# Patient Record
Sex: Male | Born: 1950 | Race: White | Hispanic: No | Marital: Married | Smoking: Former smoker
Health system: Southern US, Community
[De-identification: ages and names within clinical notes are randomized; demographics above are authoritative.]

## PROBLEM LIST (undated history)

## (undated) DIAGNOSIS — F419 Anxiety disorder, unspecified: Secondary | ICD-10-CM

## (undated) DIAGNOSIS — F329 Major depressive disorder, single episode, unspecified: Secondary | ICD-10-CM

## (undated) DIAGNOSIS — J45909 Unspecified asthma, uncomplicated: Secondary | ICD-10-CM

## (undated) DIAGNOSIS — J449 Chronic obstructive pulmonary disease, unspecified: Secondary | ICD-10-CM

## (undated) DIAGNOSIS — K5730 Diverticulosis of large intestine without perforation or abscess without bleeding: Secondary | ICD-10-CM

## (undated) DIAGNOSIS — M1990 Unspecified osteoarthritis, unspecified site: Secondary | ICD-10-CM

## (undated) DIAGNOSIS — F32A Depression, unspecified: Secondary | ICD-10-CM

## (undated) DIAGNOSIS — E785 Hyperlipidemia, unspecified: Secondary | ICD-10-CM

## (undated) DIAGNOSIS — E049 Nontoxic goiter, unspecified: Secondary | ICD-10-CM

## (undated) HISTORY — PX: HAND SURGERY: SHX662

## (undated) HISTORY — DX: Anxiety disorder, unspecified: F41.9

## (undated) HISTORY — DX: Hyperlipidemia, unspecified: E78.5

## (undated) HISTORY — DX: Unspecified osteoarthritis, unspecified site: M19.90

## (undated) HISTORY — DX: Diverticulosis of large intestine without perforation or abscess without bleeding: K57.30

## (undated) HISTORY — DX: Nontoxic goiter, unspecified: E04.9

## (undated) HISTORY — PX: CHOLECYSTECTOMY: SHX55

## (undated) HISTORY — PX: APPENDECTOMY: SHX54

---

## 2002-01-28 ENCOUNTER — Emergency Department (HOSPITAL_COMMUNITY): Admission: EM | Admit: 2002-01-28 | Discharge: 2002-01-28 | Payer: Self-pay | Admitting: Emergency Medicine

## 2002-11-19 ENCOUNTER — Emergency Department (HOSPITAL_COMMUNITY): Admission: EM | Admit: 2002-11-19 | Discharge: 2002-11-19 | Payer: Self-pay

## 2002-11-28 ENCOUNTER — Ambulatory Visit (HOSPITAL_COMMUNITY): Admission: RE | Admit: 2002-11-28 | Discharge: 2002-11-28 | Payer: Self-pay | Admitting: General Surgery

## 2004-03-12 ENCOUNTER — Emergency Department (HOSPITAL_COMMUNITY): Admission: EM | Admit: 2004-03-12 | Discharge: 2004-03-12 | Payer: Self-pay | Admitting: Emergency Medicine

## 2004-04-19 ENCOUNTER — Ambulatory Visit (HOSPITAL_BASED_OUTPATIENT_CLINIC_OR_DEPARTMENT_OTHER): Admission: RE | Admit: 2004-04-19 | Discharge: 2004-04-19 | Payer: Self-pay | Admitting: Orthopedic Surgery

## 2004-04-19 ENCOUNTER — Encounter (INDEPENDENT_AMBULATORY_CARE_PROVIDER_SITE_OTHER): Payer: Self-pay | Admitting: Specialist

## 2004-04-19 ENCOUNTER — Ambulatory Visit (HOSPITAL_COMMUNITY): Admission: RE | Admit: 2004-04-19 | Discharge: 2004-04-19 | Payer: Self-pay | Admitting: Orthopedic Surgery

## 2006-03-16 ENCOUNTER — Emergency Department (HOSPITAL_COMMUNITY): Admission: EM | Admit: 2006-03-16 | Discharge: 2006-03-16 | Payer: Self-pay | Admitting: Emergency Medicine

## 2006-03-24 ENCOUNTER — Emergency Department (HOSPITAL_COMMUNITY): Admission: EM | Admit: 2006-03-24 | Discharge: 2006-03-24 | Payer: Self-pay | Admitting: Emergency Medicine

## 2006-03-26 ENCOUNTER — Emergency Department (HOSPITAL_COMMUNITY): Admission: EM | Admit: 2006-03-26 | Discharge: 2006-03-26 | Payer: Self-pay | Admitting: Emergency Medicine

## 2006-12-04 ENCOUNTER — Ambulatory Visit (HOSPITAL_COMMUNITY): Admission: RE | Admit: 2006-12-04 | Discharge: 2006-12-04 | Payer: Self-pay | Admitting: General Surgery

## 2007-04-16 ENCOUNTER — Encounter (INDEPENDENT_AMBULATORY_CARE_PROVIDER_SITE_OTHER): Payer: Self-pay | Admitting: Orthopedic Surgery

## 2007-04-16 ENCOUNTER — Ambulatory Visit (HOSPITAL_BASED_OUTPATIENT_CLINIC_OR_DEPARTMENT_OTHER): Admission: RE | Admit: 2007-04-16 | Discharge: 2007-04-16 | Payer: Self-pay | Admitting: Orthopedic Surgery

## 2010-02-27 ENCOUNTER — Encounter: Payer: Self-pay | Admitting: Chiropractic Medicine

## 2010-06-21 NOTE — H&P (Signed)
NAME:  [name], [name]                ACCOUNT NO.:  [id number]   MEDICAL RECORD NO.:  [id number]          PATIENT TYPE:  AMB   LOCATION:  DAY                           FACILITY:  [facility]   PHYSICIAN:  Dalia Heading, M.D.  DATE OF BIRTH:  [birth year]   DATE OF ADMISSION:  12/04/2006  DATE OF DISCHARGE:  LH                              HISTORY & PHYSICAL   AGE:  Is 60 years old.   CHIEF COMPLAINT:  Need for screening colonoscopy.   HISTORY OF PRESENT ILLNESS:  The patient is a 60 year old white male who  was referred for endoscopic evaluation.  He needs a colonoscopy for  screening purposes.  No abdominal pain, weight loss, nausea, vomiting,  diarrhea, constipation, melena or hematochezia have been noted.  He has  never had a colonoscopy.  There is no family history of colon carcinoma.   PAST MEDICAL HISTORY:  Includes depression.   PAST SURGICAL HISTORY:  Appendectomy, laparoscopic cholecystectomy.   CURRENT MEDICATIONS:  Paxil.   ALLERGIES:  No known drug allergies.   REVIEW OF SYSTEMS:  Noncontributory.   PHYSICAL EXAMINATION:  GENERAL:  The patient is a well-developed and  well-nourished white male, in no acute distress.  LUNGS:  Clear to auscultation with good breath sounds bilaterally.  HEART:  Exam reveals a regular rate and rhythm without S3, S4 or  murmurs.  ABDOMEN:  Soft, nontender, non-distended.  No hepatosplenomegaly or  masses are noted.  RECTAL:  Deferred until the procedure.   IMPRESSION:  Need for screening colonoscopy.   PLAN:  The patient is scheduled for a colonoscopy on December 04, 2006.  The risks and benefits of the procedure, including bleeding and  perforation, were fully explained to the patient who gave an informed  consent.      Dalia Heading, M.D.  Electronically Signed     MAJ/MEDQ  D:  11/27/2006  T:  11/28/2006  Job:  [id number]   cc:   Patrica Duel, M.D.  Fax: [phone]

## 2010-06-21 NOTE — Op Note (Signed)
NAME:  [name], [name]                ACCOUNT NO.:  [id number]   MEDICAL RECORD NO.:  [id number]          PATIENT TYPE:  AMB   LOCATION:  DSC                          FACILITY:  [facility]   PHYSICIAN:  Katy Fitch. Sypher, M.D. DATE OF BIRTH:  October 09, 1950   DATE OF PROCEDURE:  04/16/2007  DATE OF DISCHARGE:                               OPERATIVE REPORT   PREOPERATIVE DIAGNOSIS:  Recurrent enchondroma, right small finger  proximal phalanx, status post pathologic fracture, followed by curettage  of bone grafting completed April 19, 2004.   POSTOPERATIVE DIAGNOSIS:  Recurrent enchondroma, right small finger  proximal phalanx, status post pathologic fracture, followed by curettage  of bone grafting completed April 19, 2004.   OPERATION:  Extensive curettage of right small finger proximal phalanx,  proximal metaphysis and diaphysis followed by a pressure saline  irrigation to remove all residual enchondroma, followed by a freeze-  dried cancellus bone graft.   OPERATING SURGEON:  Josephine Igo, M.D.   ASSISTANT:  Annye Rusk, PA-C.   ANESTHESIA:  General by LMA.   SUPERVISING ANESTHESIOLOGIST:  Dr. Noreene Larsson.   INDICATIONS:  [name] is a 60 year old gentleman referred through  the courtesy of Dr. [name] of [place].  He has a history of fracturing his right small finger proximal phalanx  in 2006.  He was noted to have a large enchondroma in the proximal  metaphysis.  He was initially immobilized to obtain a stable healed  fracture and subsequent underwent curettage and bone grafting of his  enchondroma.   In 2006 he appeared to be healing his enchondroma in a satisfactory  manner.  He was lost to follow-up skipping his last office appointment  and did not have a final follow-up x-ray.   He was contacted by phone, but apparently chose not to return for a  final evaluation.   He did well until early 2009 at which time he returned to the office  reporting that when he shook hands, he had some pain in his small  finger.  Once again.   X-rays of his finger were obtained and he was noted to have a extension  of his enchondroma towards the diaphysis of the small finger proximal  phalanx.  The site of prior fracture and the prior metaphyseal bone  graft appeared to be well incorporated.   We advised [name] to proceed with a secondary procedure at this time  anticipating a curettage of the diaphyseal segment of his enchondroma  and additional bone grafting.  Preoperatively he was reminded of the  uncertain prognosis of these benign tumors.   Typically enchondromas are identified as pathologic fractures.   It is optimum to monitor them with serial x-rays to be certain that they  are eradicated.   We will encourage him to continue with routine follow-up in the future.   Preoperatively he was interviewed by Dr. Noreene Larsson to discuss anesthesia  choices risks and benefits.  [name] decided to proceed with general  anesthesia by LMA technique.  Questions were invited and answered in  detail.   PROCEDURE:  James Maduro  Johntay Johns is brought to the operating room and  placed supine position on the operating table.   Following induction general anesthesia by LMA technique, the right arm  was prepped with Betadine soap solution, sterilely draped.  A pneumatic  tourniquet was applied to the proximal right brachium.  1 gram of Ancef  was administered as IV prophylactic antibiotic.   Following exsanguination of the right arm with an Esmarch bandage, the  arterial tourniquet inflated to 220 mmHg.  Procedure commenced with a  longitudinal and ulnar mid lateral incision exposing the proximal  phalangeal diaphysis.   The extensor mechanism was identified and the periosteum isolated.  Retractors were placed followed by use of an 18 gauge needle to trephine  the cortex.  The cortex at the central diaphysis was thin due to the  presence of the  expansile enchondroma.  We easily entered the  intermedullary canal and obtained a core biopsy of the enchondroma.  A C-  arm fluoroscope was used to outline the length of the enchondroma  followed by trephining with a 18 gauge needle once again and use of a 64  beaver blade to create an oval window through the ulnar cortex of the  proximal phalanx.   We spent more than 30 minutes meticulously curetting the enchondroma  cavity with a micro curette, both straight and curved and pressure  irrigating the cavity with saline injected through an 18 gauge needle  and use of suction irrigation with the catheter placed within the cavity  and irrigation being provided with a syringe.   Thereafter, we used the C-arm fluoroscope to delineate the area of  curettage and deemed an acceptable curettage of the entire enchondroma  chamber based on the C-arm images.   A freeze-dried cancellus allograft was then meticulously packed into the  cavity created using primarily small cancellus flakes followed by larger  fragments to occlude the oval window created.   After this was completed, the C-arm fluoroscope was used to confirm a  very satisfactory obliteration of the cavity of the enchondroma.   The wound was then repaired with intradermal 3-0 Prolene and Steri-  Strips.  The finger was anesthetized with a 2% lidocaine metacarpal head  level block for postop analgesia.  For aftercare, [name] is provided  prescriptions for  Percocet 5 mg one by mouth every four to six hours as needed for pain,  30 tablets without refill.  Also Motrin 600 mg one by mouth every six  hours as needed for pain and Keflex 500 mg one by mouth every eight  hours x4 days as a prophylactic antibiotic.      Katy Fitch Sypher, M.D.  Electronically Signed     RVS/MEDQ  D:  04/16/2007  T:  04/17/2007  Job:  [id number]   cc:   [name]

## 2010-06-21 NOTE — H&P (Signed)
NAME:  ABDULKARIM, [name]                ACCOUNT NO.:  [id number]   MEDICAL RECORD NO.:  [id number]          PATIENT TYPE:  AMB   LOCATION:  DAY                           FACILITY:  [facility]   PHYSICIAN:  Dalia Heading, M.D.  DATE OF BIRTH:  [birth year]   DATE OF ADMISSION:  12/04/2006  DATE OF DISCHARGE:  LH                              HISTORY & PHYSICAL   AGE:  Is 60 years old.   CHIEF COMPLAINT:  Need for screening colonoscopy.   HISTORY OF PRESENT ILLNESS:  The patient is a 59 year old white male who  was referred for endoscopic evaluation.  He needs a colonoscopy for  screening purposes.  No abdominal pain, weight loss, nausea, vomiting,  diarrhea, constipation, melena or hematochezia have been noted.  He has  never had a colonoscopy.  There is no family history of colon carcinoma.   PAST MEDICAL HISTORY:  Includes depression.   PAST SURGICAL HISTORY:  Appendectomy, laparoscopic cholecystectomy.   CURRENT MEDICATIONS:  Paxil.   ALLERGIES:  No known drug allergies.   REVIEW OF SYSTEMS:  Noncontributory.   PHYSICAL EXAMINATION:  GENERAL:  The patient is a well-developed and  well-nourished white male, in no acute distress.  LUNGS:  Clear to auscultation with good breath sounds bilaterally.  HEART:  Exam reveals a regular rate and rhythm without S3, S4 or  murmurs.  ABDOMEN:  Soft, nontender, non-distended.  No hepatosplenomegaly or  masses are noted.  RECTAL:  Deferred until the procedure.   IMPRESSION:  Need for screening colonoscopy.   PLAN:  The patient is scheduled for a colonoscopy on December 04, 2006.  The risks and benefits of the procedure, including bleeding and  perforation, were fully explained to the patient who gave an informed  consent.      Dalia Heading, M.D.  Electronically Signed     MAJ/MEDQ  D:  11/27/2006  T:  11/28/2006  Job:  [id number]   cc:   Patrica Duel, M.D.  Fax: [phone]

## 2010-06-24 NOTE — Op Note (Signed)
NAME:  LEMONTE, AL                          ACCOUNT NO.:  [id number]   MEDICAL RECORD NO.:  [id number]                   PATIENT TYPE:  AMB   LOCATION:  DAY                                  FACILITY:  [facility]   PHYSICIAN:  Dalia Heading, M.D.               DATE OF BIRTH:  November 01, 1950   DATE OF PROCEDURE:  11/28/2002  DATE OF DISCHARGE:                                 OPERATIVE REPORT   PREOPERATIVE DIAGNOSES:  Cholecystitis, cholelithiasis.   POSTOPERATIVE DIAGNOSES:  Cholecystitis, cholelithiasis.   PROCEDURE:  Laparoscopic cholecystectomy.   SURGEON:  Dalia Heading, M.D.   ASSISTANT:  Bernerd Limbo. Leona Carry, M.D.   ANESTHESIA:  General endotracheal   INDICATIONS:  The patient is a 60 year old white male who is referred for  evaluation and treatment of cholecystitis secondary to cholelithiasis.  The  risks and benefits of the procedure including bleeding, infection,  hepatobiliary injury, and the possibility of an open procedure were fully  explained to the patient, who gave informed consent.   DESCRIPTION OF PROCEDURE:  The patient was placed in the supine position.  After induction of general endotracheal anesthesia, the abdomen was prepped  and draped using the usual sterile technique with Betadine.  Surgical site  confirmation was performed.   An supraumbilical incision was made down to the fascia.  A Veress needle was  introduced into the abdominal cavity and confirmation of placement was done  using the saline drop test.  The abdomen was then insufflated to 16 mmHg  pressure.  An 11-mm trocar was introduced into the abdominal cavity under  direct visualization without difficulty.  The patient was placed then in  reverse Trendelenburg position and an additional 11-mm trocar was placed in  the epigastric region and 5-mm trocars were placed in the right upper  quadrant and right flank regions. The liver was inspected and noted to be  within normal limits.   The  gallbladder was retracted superiorly and laterally.  The dissection was  begun around the infundibulum of the gallbladder.  The cystic duct was first  identified.  Its junction to the infundibulum fully identified.  Endoclips  were placed proximally and distally on the cystic duct; and the cystic duct  was divided.  This was likewise done on the cystic artery.  The gallbladder  was then freed away from the gallbladder fossa using Bovie electrocautery.  The gallbladder was delivered through the epigastric trocar site using an  EndoCatch bag.  The gallbladder fossa was inspected and no abnormal bleeding  or bile leakage was noted.  Surgicel was placed in the gallbladder fossa,  the subhepatic space, as well as the right hepatic gutter were evacuated of  all fluid and air prior to removal of the trocars.   All wounds were irrigated with normal saline.  All wounds were injected with  0.5% Sensorcaine.  The supraumbilical fascia as well as epigastric  fascia  were reapproximated using an #0 Vicryl interrupted suture. All skin  incisions were closed using staples.  Betadine ointment and dry sterile  dressings were applied.   All tape and needle counts correct at the end of the procedure.  The patient  was extubated in the operating room and went back to recovery room in awake  and stable condition.   COMPLICATIONS:  None.   SPECIMENS:  Gallbladder with stones.   BLOOD LOSS:  Minimal.      ___________________________________________                                            Dalia Heading, M.D.   MAJ/MEDQ  D:  11/28/2002  T:  11/28/2002  Job:  [id number]   cc:   Patrica Duel, M.D.  [address]  [place]  [place] [address]  Fax: [phone]

## 2010-06-24 NOTE — Op Note (Signed)
NAME:  [name], [name]                ACCOUNT NO.:  [id number]   MEDICAL RECORD NO.:  [id number]          PATIENT TYPE:  AMB   LOCATION:  DSC                          FACILITY:  [facility]   PHYSICIAN:  Katy Fitch. Sypher Montez Hageman., M.D.DATE OF BIRTH:  Sep 21, 1950   DATE OF PROCEDURE:  04/19/2004  DATE OF DISCHARGE:                                 OPERATIVE REPORT   PREOPERATIVE DIAGNOSIS:  Status post pathologic fracture of right small  finger proximal phalangeal, proximal metaphysis due to enchondroma, status  post healing of fracture with closed reduction and splinting times five  weeks with secondary resection of enchondroma at this time and allograft  cancellus grafting.   OPERATION SURGEON:  Katy Fitch. Sypher, M.D.   ASSISTANT:  Marveen Reeks. Dasnoit, P.A.C.   ANESTHESIA:  Infraclavicular block of right upper extremity.   SUPERVISING ANESTHESIOLOGIST:  Dr. Krista Blue.   INDICATIONS:  [name] is a 60 year old gentleman referred by Dr. Earlyne Iba of the [facility] Room for evaluation of a  pathologic fracture of his right small finger proximal phalanx sustained on  March 12, 2004.   [name] was noted to have an unstable telescoping fracture with slight  loss of length due to a enchondroma that had fractured circumferentially.   We recommended at the time of his initial fraction that we treated with  splinting, allowing the fracture to heal followed by resection of the  enchondroma and cancellus allograft.   [name] has been immobilized since the time of his injury and went on to  heal his fracture with a near anatomic result except for a very slight  shortening the P1 segment.   He did not have any extensor lag at his PIP joint.   His fracture is now stable. Therefore, he returns anticipating a limited  exposure of the enchondroma followed by thorough curettage, irrigation and  cancellus allografting.   After informed consent he is brought to the operating  at this time.   Preoperatively he was advised of potential risks, benefits of surgery. He  understands there is a chance for second pathologic fracture after windowing  of his enchondroma.   We anticipate a minimum of three weeks additional splinting following  grafting.   PROCEDURE:  [name] is brought to the operating room and placed  in supine position on the operating table.   Following an infraclavicular block by Dr. Krista Blue in the holding area,  anesthesia satisfactory in the right arm.   The arm was prepped with Betadine soap solution, sterilely draped. Ancef 1 g  was administered as an IV prophylactic antibiotic.   Following exsanguination of the right arm with an Esmarch bandage, an  arterial tourniquet on the proximal brachium was inflated to 240 mmHg.   Procedure commenced with a curvilinear incision over the proximal two thirds  of the P1 segment of his right small finger. Subcutaneous tissue were  carefully divided taking care to identify and electrocauterize the  transverse veins overlying the extensor mechanism.   The extensor digitorum longus was incised longitudinally and the periosteum  of  the proximal phalanx was incised over a distance of 1.5 cm.   A small window was created on the dorsal aspect of P1 measuring 4 mm in  length and 3 mm width.   The enchondroma was meticulously curetted, removing more than 1 cubic mL of  immature cartilage and calcified material. This was placed in formalin and  passed off for pathologic evaluation.   The enchondroma cavity was then thoroughly irrigated with a dental needle  and a 10 mL syringe until all effluent was clear. The lesion was then  curetted the second time tract remove all membrane and remnants of  cartilage.   After final irrigation, the cavity was then packed with a finely ground  cancellus allograft and basically packed as tight as possible.   The cortical window was then replaced and the  periosteum repaired with  interrupted suture of 4-0 Vicryl with knots buried.   The extensor mechanism then repaired with a running suture of 4-0 Mersilene  and the skin repaired with intradermal 3-0 Prolene.   [name] was placed in the safe position with his ring and small fingers  buddy-strapped. He is placed a hand sandwich splint to protect his  construct.   There were no apparent complications.   For aftercare, he is given prescriptions for Percocet 5 mg one p.o. q.4-6h.  for pain 30 tablets without refill. Also, Keflex 5 mg one p.o. q.8 hours x4  days as a prophylactic antibiotic.   He is discharged home to the care of his family. He will return to the  office for follow-up in one week for dressing change and repeat x-ray.   As stated before, he will need to be splinted a minimum three weeks and  perhaps as long as six weeks pending clear healing of his fracture.      RVS/MEDQ  D:  04/19/2004  T:  04/19/2004  Job:  [id number]

## 2010-10-31 LAB — POCT HEMOGLOBIN-HEMACUE: Hemoglobin: 16.2

## 2011-01-22 ENCOUNTER — Emergency Department (HOSPITAL_COMMUNITY): Payer: PRIVATE HEALTH INSURANCE

## 2011-01-22 ENCOUNTER — Encounter: Payer: Self-pay | Admitting: *Deleted

## 2011-01-22 ENCOUNTER — Emergency Department (HOSPITAL_COMMUNITY)
Admission: EM | Admit: 2011-01-22 | Discharge: 2011-01-22 | Disposition: A | Discharge status: Home / Self Care | Payer: PRIVATE HEALTH INSURANCE | Attending: Emergency Medicine | Admitting: Emergency Medicine

## 2011-01-22 DIAGNOSIS — J40 Bronchitis, not specified as acute or chronic: Secondary | ICD-10-CM

## 2011-01-22 DIAGNOSIS — J111 Influenza due to unidentified influenza virus with other respiratory manifestations: Secondary | ICD-10-CM

## 2011-01-22 DIAGNOSIS — R05 Cough: Secondary | ICD-10-CM | POA: Insufficient documentation

## 2011-01-22 DIAGNOSIS — R0602 Shortness of breath: Secondary | ICD-10-CM | POA: Insufficient documentation

## 2011-01-22 DIAGNOSIS — R51 Headache: Secondary | ICD-10-CM | POA: Insufficient documentation

## 2011-01-22 DIAGNOSIS — R059 Cough, unspecified: Secondary | ICD-10-CM | POA: Insufficient documentation

## 2011-01-22 DIAGNOSIS — R6889 Other general symptoms and signs: Secondary | ICD-10-CM | POA: Insufficient documentation

## 2011-01-22 HISTORY — DX: Depression, unspecified: F32.A

## 2011-01-22 HISTORY — DX: Major depressive disorder, single episode, unspecified: F32.9

## 2011-01-22 MED ORDER — OSELTAMIVIR PHOSPHATE 75 MG PO CAPS: 75.0000 mg | ORAL_CAPSULE | Freq: Two times a day (BID) | ORAL | Status: AC

## 2011-01-22 MED ORDER — AZITHROMYCIN 250 MG PO TABS: ORAL_TABLET | ORAL | Status: DC

## 2011-01-22 MED ORDER — ALBUTEROL SULFATE (5 MG/ML) 0.5% IN NEBU
5.0000 mg | INHALATION_SOLUTION | Freq: Once | RESPIRATORY_TRACT | Status: AC
  Administered 2011-01-22: 5 mg via RESPIRATORY_TRACT
  Filled 2011-01-22: qty 1

## 2011-01-22 MED ORDER — IPRATROPIUM BROMIDE 0.02 % IN SOLN
0.5000 mg | Freq: Once | RESPIRATORY_TRACT | Status: AC
  Administered 2011-01-22: 0.5 mg via RESPIRATORY_TRACT
  Filled 2011-01-22: qty 2.5

## 2011-01-22 MED ORDER — PREDNISONE 20 MG PO TABS
40.0000 mg | ORAL_TABLET | Freq: Once | ORAL | Status: AC
  Administered 2011-01-22: 40 mg via ORAL
  Filled 2011-01-22: qty 2

## 2011-01-22 MED ORDER — PREDNISONE 20 MG PO TABS: 40.0000 mg | ORAL_TABLET | Freq: Every day | ORAL | Status: DC

## 2011-01-22 NOTE — Discharge Instructions (Signed)
Bronchitis Bronchitis is the body's way of reacting to injury and/or infection (inflammation) of the bronchi. Bronchi are the air tubes that extend from the windpipe into the lungs. If the inflammation becomes severe, it may cause shortness of breath. CAUSES  Inflammation may be caused by:  A virus.   Germs (bacteria).   Dust.   Allergens.   Pollutants and many other irritants.  The cells lining the bronchial tree are covered with tiny hairs (cilia). These constantly beat upward, away from the lungs, toward the mouth. This keeps the lungs free of pollutants. When these cells become too irritated and are unable to do their job, mucus begins to develop. This causes the characteristic cough of bronchitis. The cough clears the lungs when the cilia are unable to do their job. Without either of these protective mechanisms, the mucus would settle in the lungs. Then you would develop pneumonia. Smoking is a common cause of bronchitis and can contribute to pneumonia. Stopping this habit is the single most important thing you can do to help yourself. TREATMENT   Your caregiver may prescribe an antibiotic if the cough is caused by bacteria. Also, medicines that open up your airways make it easier to breathe. Your caregiver may also recommend or prescribe an expectorant. It will loosen the mucus to be coughed up. Only take over-the-counter or prescription medicines for pain, discomfort, or fever as directed by your caregiver.   Removing whatever causes the problem (smoking, for example) is critical to preventing the problem from getting worse.   Cough suppressants may be prescribed for relief of cough symptoms.   Inhaled medicines may be prescribed to help with symptoms now and to help prevent problems from returning.   For those with recurrent (chronic) bronchitis, there may be a need for steroid medicines.  SEEK IMMEDIATE MEDICAL CARE IF:   During treatment, you develop more pus-like mucus  (purulent sputum).   You have a fever.   Your baby is older than 3 months with a rectal temperature of 102 F (38.9 C) or higher.   Your baby is 3 months old or younger with a rectal temperature of 100.4 F (38 C) or higher.   You become progressively more ill.   You have increased difficulty breathing, wheezing, or shortness of breath.  It is necessary to seek immediate medical care if you are elderly or sick from any other disease. MAKE SURE YOU:   Understand these instructions.   Will watch your condition.   Will get help right away if you are not doing well or get worse.  Document Released: 01/23/2005 Document Revised: 10/05/2010 Document Reviewed: 12/03/2007 [employer] Patient Information 2012 [employer]. 

## 2011-01-22 NOTE — ED Provider Notes (Signed)
History     CSN: [id number] Arrival date & time: 01/22/2011  2:27 AM   First MD Initiated Contact with Patient 01/22/11 0243      Chief Complaint  Patient presents with  . Fever  . Chills  . Headache  . Generalized Body Aches  . Shortness of Breath  . Cough    (Consider location/radiation/quality/duration/timing/severity/associated sxs/prior treatment) HPI Comments: Patient reports for 24 hours had sudden onset of body aches, diffuse headache, mild shortness of breath associated with wheezing. He does have a history of asthma. He did not have his influenza vaccination this year. He endorses mild sore throat and occasionally productive cough. He denies any vomiting or diarrhea. He has been taking some over-the-counter medications with limited improvement. He also endorses some decreased by mouth intake and he feels thirsty and somewhat dehydrated. He has been using his nebulizer at home with some improvement of his shortness of breath. He denies any specific chest pain or sweats associated with it.  Patient is a 60 y.o. male presenting with fever, headaches, shortness of breath, and cough. The history is provided by the patient.  Fever Primary symptoms of the febrile illness include fever, headaches, cough and shortness of breath.  Headache  Associated symptoms include a fever and shortness of breath.  Shortness of Breath  Associated symptoms include a fever, cough and shortness of breath.  Cough Associated symptoms include headaches and shortness of breath.    Past Medical History  Diagnosis Date  . Depression     Past Surgical History  Procedure Date  . Hand surgery   . Appendectomy   . Cholecystectomy     History reviewed. No pertinent family history.  History  Substance Use Topics  . Smoking status: Former Games developer  . Smokeless tobacco: Not on file  . Alcohol Use: Yes     occasionally      Review of Systems  Constitutional: Positive for fever.  Respiratory:  Positive for cough and shortness of breath.   Neurological: Positive for headaches.  All other systems reviewed and are negative.    Allergies  Review of patient's allergies indicates no known allergies.  Home Medications   Current Outpatient Rx  Name Route Sig Dispense Refill  . PAROXETINE HCL 10 MG PO TABS Oral Take 10 mg by mouth every morning.        BP 163/79  Pulse 86  Temp(Src) 99.6 F (37.6 C) (Oral)  Resp 20  Ht 6\' 2"  (1.88 m)  Wt 210 lb (95.255 kg)  BMI 26.96 kg/m2  SpO2 94%  Physical Exam  Nursing note and vitals reviewed. Constitutional: He is oriented to person, place, and time. He appears well-developed and well-nourished. No distress.  HENT:  Head: Normocephalic and atraumatic.  Eyes: Pupils are equal, round, and reactive to light.  Neck: Normal range of motion. Neck supple.  Cardiovascular: Normal rate.   No murmur heard. Pulmonary/Chest: No respiratory distress. He has wheezes.  Abdominal: Soft. Bowel sounds are normal. He exhibits no distension. There is no tenderness. There is no rebound and no guarding.  Musculoskeletal: Normal range of motion.  Neurological: He is alert and oriented to person, place, and time.  Skin: Skin is warm. No rash noted.    ED Course  Procedures (including critical care time)  Labs Reviewed - No data to display No results found.   No diagnosis found.    MDM  Symptoms consistent with bronchitis and possibly influenza.  Nebs given, pt feels improved.  Will encourage PO intake, PO steroids also for bronchitis and will consider tamiflu if CXR shows no infiltrates.  Pt is a non smoker. RA sat 94% and normal.        I reviewed pt's CXR, per radiologist, no acute changes, no infiltrate.  Will prescribe tamiflu and prednisone for patient.    Gavin Pound. Oletta Lamas, MD 01/22/11 1610

## 2011-01-22 NOTE — ED Notes (Signed)
Pt c/o fever, chills, sob, cough, body aches, headache, poor PO intake x 24hrs.

## 2012-01-26 ENCOUNTER — Emergency Department (HOSPITAL_COMMUNITY)
Admission: EM | Admit: 2012-01-26 | Discharge: 2012-01-27 | Disposition: A | Discharge status: Home / Self Care | Payer: BC Managed Care – PPO | Attending: Emergency Medicine | Admitting: Emergency Medicine

## 2012-01-26 ENCOUNTER — Emergency Department (HOSPITAL_COMMUNITY): Payer: BC Managed Care – PPO

## 2012-01-26 ENCOUNTER — Other Ambulatory Visit: Payer: Self-pay

## 2012-01-26 ENCOUNTER — Encounter (HOSPITAL_COMMUNITY): Payer: Self-pay | Admitting: Emergency Medicine

## 2012-01-26 DIAGNOSIS — J45901 Unspecified asthma with (acute) exacerbation: Secondary | ICD-10-CM | POA: Insufficient documentation

## 2012-01-26 DIAGNOSIS — J45909 Unspecified asthma, uncomplicated: Secondary | ICD-10-CM

## 2012-01-26 DIAGNOSIS — J3489 Other specified disorders of nose and nasal sinuses: Secondary | ICD-10-CM | POA: Insufficient documentation

## 2012-01-26 DIAGNOSIS — R0789 Other chest pain: Secondary | ICD-10-CM | POA: Insufficient documentation

## 2012-01-26 DIAGNOSIS — Z79899 Other long term (current) drug therapy: Secondary | ICD-10-CM | POA: Insufficient documentation

## 2012-01-26 DIAGNOSIS — Z87891 Personal history of nicotine dependence: Secondary | ICD-10-CM | POA: Insufficient documentation

## 2012-01-26 DIAGNOSIS — R05 Cough: Secondary | ICD-10-CM | POA: Insufficient documentation

## 2012-01-26 DIAGNOSIS — R059 Cough, unspecified: Secondary | ICD-10-CM | POA: Insufficient documentation

## 2012-01-26 DIAGNOSIS — Z8659 Personal history of other mental and behavioral disorders: Secondary | ICD-10-CM | POA: Insufficient documentation

## 2012-01-26 HISTORY — DX: Unspecified asthma, uncomplicated: J45.909

## 2012-01-26 MED ORDER — IPRATROPIUM BROMIDE 0.02 % IN SOLN
RESPIRATORY_TRACT | Status: AC
  Administered 2012-01-26: 0.5 mg
  Filled 2012-01-26: qty 2.5

## 2012-01-26 MED ORDER — ALBUTEROL SULFATE (5 MG/ML) 0.5% IN NEBU
INHALATION_SOLUTION | RESPIRATORY_TRACT | Status: AC
  Administered 2012-01-26: 5 mg
  Filled 2012-01-26: qty 1

## 2012-01-26 MED ORDER — PREDNISONE 50 MG PO TABS
60.0000 mg | ORAL_TABLET | Freq: Once | ORAL | Status: AC
  Administered 2012-01-26: 60 mg via ORAL
  Filled 2012-01-26: qty 1

## 2012-01-26 NOTE — ED Notes (Signed)
Patient complaining of shortness of breath. Reports is having an asthma attack that started approximately 3 hours ago. Patient diaphoretic, room air sat 83%.

## 2012-01-26 NOTE — ED Notes (Signed)
Patient now at 90% on 4 liters of oxygen via nasal canula, respiratory has been paged.

## 2012-01-26 NOTE — ED Provider Notes (Signed)
History  This chart was scribed for [employer]. Colon Branch, MD by Bennett Scrape, ED Scribe. This patient was seen in room [facility] and the patient's care was started at 11:35 PM.   CSN: [id number]  Arrival date & time 01/26/12  2307   First MD Initiated Contact with Patient 01/26/12 2335      Chief Complaint  Patient presents with  . Shortness of Breath    The history is provided by the patient. No language interpreter was used.   [name] is a 61 y.o. male with a h/o asthma who presents to the Emergency Department complaining of gradual onset, gradually worsening, constant asthma exacerbation with associated wheezing, chest tightness, SOB and diaphoresis that start 3 hours ago. He states that the trigger for this episode was the nasal congestion and cough that started 4 days ago. He has a nebulizer and an albuterol inhaler at home but states that he did not use either. He reports that he received an albuterol breathing treatment in the ED with improvement in his symptoms. He denies known fevers, myalgias and emesis as associated symptoms. He also has a h/o depression, is an occasional alcohol user and is a former smoker.  Dr. Christene Slates.  Past Medical History  Diagnosis Date  . Depression   . Asthma     Past Surgical History  Procedure Date  . Hand surgery   . Appendectomy   . Cholecystectomy     History reviewed. No pertinent family history.  History  Substance Use Topics  . Smoking status: Former Games developer  . Smokeless tobacco: Not on file  . Alcohol Use: Yes     Comment: occasionally      Review of Systems  Constitutional: Negative for fever and chills.       A complete 10 system review of systems was obtained and all systems are negative except as noted in the HPI and PMH.   HENT: Positive for congestion. Negative for sore throat.   Respiratory: Positive for cough, chest tightness, shortness of breath and wheezing.   Cardiovascular: Negative for chest pain.   Gastrointestinal: Negative for nausea, vomiting, abdominal pain and diarrhea.  Musculoskeletal: Negative for myalgias.  All other systems reviewed and are negative.    Allergies  Review of patient's allergies indicates no known allergies.  Home Medications   Current Outpatient Rx  Name  Route  Sig  Dispense  Refill  . AZITHROMYCIN 250 MG PO TABS      2 tablets po now, then 1 tablet po qd on days 2-5   6 tablet   0   . PAROXETINE HCL 10 MG PO TABS   Oral   Take 10 mg by mouth every morning.           Marland Kitchen PREDNISONE 20 MG PO TABS   Oral   Take 2 tablets (40 mg total) by mouth daily.   10 tablet   0     Triage Vitals: BP 160/90  Pulse 112  Temp 99.3 F (37.4 C) (Oral)  Resp 26  Ht 6\' 2"  (1.88 m)  Wt 210 lb (95.255 kg)  BMI 26.96 kg/m2  SpO2 91%  Physical Exam  Nursing note and vitals reviewed. Constitutional: He is oriented to person, place, and time. He appears well-developed and well-nourished. No distress.  HENT:  Head: Normocephalic and atraumatic.  Mouth/Throat: Oropharynx is clear and moist.  Eyes: Conjunctivae normal and EOM are normal. Pupils are equal, round, and reactive to light.  Neck:  Normal range of motion. Neck supple. No tracheal deviation present.  Cardiovascular: Normal rate and regular rhythm.   No murmur heard. Pulmonary/Chest: Effort normal. No respiratory distress. He has wheezes (expiratory wheezes).       O2 sat on 2L post treatment is 89%, pt is speaking in full sentences, good air movement noted  Abdominal: Soft. There is no tenderness.  Musculoskeletal: Normal range of motion. He exhibits no edema.  Neurological: He is alert and oriented to person, place, and time.  Skin: Skin is warm and dry.  Psychiatric: He has a normal mood and affect. His behavior is normal.    ED Course  Procedures (including critical care time)  DIAGNOSTIC STUDIES: Oxygen Saturation is 91% on 2L of O2, low by my interpretation.    COORDINATION OF  CARE: 11:45 PM- Discussed treatment plan which includes prednisone with pt at bedside and pt agreed to plan. Results for orders placed during the hospital encounter of 01/26/12  CBC      Component Value Range   WBC 7.6  4.0 - 10.5 K/uL   RBC 4.65  4.22 - 5.81 MIL/uL   Hemoglobin 13.6  13.0 - 17.0 g/dL   HCT 16.1  09.6 - 04.5 %   MCV 86.0  78.0 - 100.0 fL   MCH 29.2  26.0 - 34.0 pg   MCHC 34.0  30.0 - 36.0 g/dL   RDW 40.9  81.1 - 91.4 %   Platelets 204  150 - 400 K/uL  BASIC METABOLIC PANEL      Component Value Range   Sodium 140  135 - 145 mEq/L   Potassium 4.1  3.5 - 5.1 mEq/L   Chloride 104  96 - 112 mEq/L   CO2 28  19 - 32 mEq/L   Glucose, Bld 139 (*) 70 - 99 mg/dL   BUN 15  6 - 23 mg/dL   Creatinine, Ser 7.82  0.50 - 1.35 mg/dL   Calcium 8.8  8.4 - 95.6 mg/dL   GFR calc non Af Amer 65 (*) >90 mL/min   GFR calc Af Amer 75 (*) >90 mL/min  Dg Chest 2 View  01/27/2012  *RADIOLOGY REPORT*  Clinical Data: Short of breath.  CHEST - 2 VIEW  Comparison: 01/22/2011.  Findings:  Cardiopericardial silhouette within normal limits. Mediastinal contours normal. Trachea midline.  No airspace disease or effusion.  Subsegmental atelectasis at the left lung base. Monitoring leads are projected over the chest.  IMPRESSION: No acute cardiopulmonary disease.   Original Report Authenticated By: Andreas Newport, M.D.       Date: 01/26/2012  2332  Rate: 112  Rhythm: sinus tachycardia  QRS Axis: normal  Intervals: normal  ST/T Wave abnormalities: normal  Conduction Disutrbances:incomplete RBBB  Narrative Interpretation:   Old EKG Reviewed: none available and changes noted new IRBBB, rate faster c/w 04/16/2007  0050 O2 sats remain 91% on O2 at 2L. Ordered additional treatment. Will take off O2 and ambulate. 0145 Patient off O2, sats drop to 90%. With ambulation O2 sats drop to 86-89% and HR increased to 135. 0230 Continuous albuterol neb given with improvement. 0330 Patient sleeping. O2 sats  fluctuate between 88-96%.  0430 O2 sats remain above 91%. Wheezing has not returned.  0550 Awakened. States he feels fine and would like to be discharged home. MDM  Patient with a h/o asthma with a recent cold who developed wheezing and shortness of breath. O2 sats low. Gradual improvement with nebulizer treatments and prednisone. Required continuous neb  treatment to bring he O2 sats up and sustain them.  Pt feels improved after observation and/or treatment in ED.Pt stable in ED with no significant deterioration in condition.The patient appears reasonably screened and/or stabilized for discharge and I doubt any other medical condition or other [facility] requiring further screening, evaluation, or treatment in the ED at this time prior to discharge.  I personally performed the services described in this documentation, which was scribed in my presence. The recorded information has been reviewed and considered.   MDM Reviewed: nursing note and vitals Interpretation: labs, ECG and x-ray  CRITICAL CARE Performed by: Annamarie Dawley. Total critical care time: 50 Critical care time was exclusive of separately billable procedures and treating other patients. Critical care was necessary to treat or prevent imminent or life-threatening deterioration. Critical care was time spent personally by me on the following activities: development of treatment plan with patient and/or surrogate as well as nursing, discussions with consultants, evaluation of patient's response to treatment, examination of patient, obtaining history from patient or surrogate, ordering and performing treatments and interventions, ordering and review of laboratory studies, ordering and review of radiographic studies, pulse oximetry and re-evaluation of patient's condition.         Nicoletta Dress. Colon Branch, MD 01/27/12 [phone]

## 2012-01-26 NOTE — ED Notes (Signed)
Respiratory at bedside.

## 2012-01-26 NOTE — ED Notes (Signed)
Patient oxygen saturation 88-89% on 4 liters of oxygen via nasal canula, Dr. Colon Branch notified.

## 2012-01-27 LAB — BASIC METABOLIC PANEL
Calcium: 8.8 mg/dL (ref 8.4–10.5)
GFR calc Af Amer: 75 mL/min — ABNORMAL LOW (ref >90)
GFR calc non Af Amer: 65 mL/min — ABNORMAL LOW (ref >90)
Potassium: 4.1 mEq/L (ref 3.5–5.1)
Sodium: 140 mEq/L (ref 135–145)

## 2012-01-27 LAB — CBC
Hemoglobin: 13.6 g/dL (ref 13.0–17.0)
MCHC: 34 g/dL (ref 30.0–36.0)
Platelets: 204 10*3/uL (ref 150–400)
RDW: 13.3 % (ref 11.5–15.5)

## 2012-01-27 MED ORDER — ALBUTEROL (5 MG/ML) CONTINUOUS INHALATION SOLN
10.0000 mg/h | INHALATION_SOLUTION | RESPIRATORY_TRACT | Status: AC
  Administered 2012-01-27: 10 mg/h via RESPIRATORY_TRACT
  Filled 2012-01-27: qty 20

## 2012-01-27 MED ORDER — ALBUTEROL SULFATE (5 MG/ML) 0.5% IN NEBU: 2.5000 mg | INHALATION_SOLUTION | Freq: Four times a day (QID) | RESPIRATORY_TRACT | Status: DC | PRN

## 2012-01-27 MED ORDER — PREDNISONE 10 MG PO TABS: 20.0000 mg | ORAL_TABLET | Freq: Every day | ORAL | Status: DC

## 2012-01-27 MED ORDER — IPRATROPIUM BROMIDE 0.02 % IN SOLN
0.5000 mg | Freq: Once | RESPIRATORY_TRACT | Status: AC
  Administered 2012-01-27: 0.5 mg via RESPIRATORY_TRACT
  Filled 2012-01-27: qty 2.5

## 2012-01-27 MED ORDER — ALBUTEROL SULFATE (5 MG/ML) 0.5% IN NEBU
5.0000 mg | INHALATION_SOLUTION | Freq: Once | RESPIRATORY_TRACT | Status: AC
  Administered 2012-01-27: 5 mg via RESPIRATORY_TRACT
  Filled 2012-01-27: qty 1

## 2012-01-27 NOTE — ED Notes (Signed)
Family at bedside. Patient does not need anything at this time. 

## 2012-01-27 NOTE — ED Notes (Signed)
Patient taken off of oxygen via nasal canula to assess room air oxygen saturation. O2 sat dropped to 88% on room air while resting. On ambulation O2 sat dropped to 89% and heart rate raised to 135. Dr. Colon Branch notified. Patient placed on 2 liters of oxygen via nasal canula.

## 2012-01-27 NOTE — ED Notes (Signed)
Patient is resting comfortably. 

## 2012-01-27 NOTE — ED Notes (Signed)
Patient on room air at this time. Oxygen saturation dropped to 88%, came back up to 92%. Patient sleeping at this time.

## 2013-03-26 ENCOUNTER — Ambulatory Visit: Payer: PRIVATE HEALTH INSURANCE | Admitting: Orthopedic Surgery

## 2013-04-15 ENCOUNTER — Ambulatory Visit: Payer: BC Managed Care – PPO | Admitting: Orthopedic Surgery

## 2013-04-24 ENCOUNTER — Ambulatory Visit: Payer: PRIVATE HEALTH INSURANCE | Admitting: Orthopedic Surgery

## 2013-04-27 ENCOUNTER — Encounter (HOSPITAL_COMMUNITY): Payer: Self-pay | Admitting: Emergency Medicine

## 2013-04-27 ENCOUNTER — Emergency Department (HOSPITAL_COMMUNITY)
Admission: EM | Admit: 2013-04-27 | Discharge: 2013-04-27 | Disposition: A | Discharge status: Home / Self Care | Payer: BC Managed Care – PPO | Attending: Emergency Medicine | Admitting: Emergency Medicine

## 2013-04-27 DIAGNOSIS — Z79899 Other long term (current) drug therapy: Secondary | ICD-10-CM | POA: Insufficient documentation

## 2013-04-27 DIAGNOSIS — T169XXA Foreign body in ear, unspecified ear, initial encounter: Secondary | ICD-10-CM | POA: Insufficient documentation

## 2013-04-27 DIAGNOSIS — F3289 Other specified depressive episodes: Secondary | ICD-10-CM | POA: Insufficient documentation

## 2013-04-27 DIAGNOSIS — Y929 Unspecified place or not applicable: Secondary | ICD-10-CM | POA: Insufficient documentation

## 2013-04-27 DIAGNOSIS — J45909 Unspecified asthma, uncomplicated: Secondary | ICD-10-CM | POA: Insufficient documentation

## 2013-04-27 DIAGNOSIS — IMO0002 Reserved for concepts with insufficient information to code with codable children: Secondary | ICD-10-CM | POA: Insufficient documentation

## 2013-04-27 DIAGNOSIS — F329 Major depressive disorder, single episode, unspecified: Secondary | ICD-10-CM | POA: Insufficient documentation

## 2013-04-27 DIAGNOSIS — Y9389 Activity, other specified: Secondary | ICD-10-CM | POA: Insufficient documentation

## 2013-04-27 DIAGNOSIS — T162XXA Foreign body in left ear, initial encounter: Secondary | ICD-10-CM

## 2013-04-27 DIAGNOSIS — Z87891 Personal history of nicotine dependence: Secondary | ICD-10-CM | POA: Insufficient documentation

## 2013-04-27 NOTE — ED Provider Notes (Signed)
CSN: [id number]     Arrival date & time 04/27/13  0618 History   First MD Initiated Contact with Patient 04/27/13 [phone]     Chief Complaint  Patient presents with  . Foreign Body in Ear      Patient is a 63 y.o. male presenting with foreign body in ear. The history is provided by the patient.  Foreign Body in Ear This is a new problem. The current episode started 12 to 24 hours ago. The problem occurs constantly. The problem has not changed since onset.Nothing aggravates the symptoms. Nothing relieves the symptoms.  pt was trying to get dirt out of left ear with a Q-tip when the end of it broke off in his ear He is unable to retrieve the tip  Past Medical History  Diagnosis Date  . Depression   . Asthma    Past Surgical History  Procedure Laterality Date  . Hand surgery    . Appendectomy    . Cholecystectomy     History reviewed. No pertinent family history. History  Substance Use Topics  . Smoking status: Former Games developermoker  . Smokeless tobacco: Not on file  . Alcohol Use: Yes     Comment: occasionally    Review of Systems  Constitutional: Negative for fever.  HENT: Positive for ear pain.       Allergies  Review of patient's allergies indicates no known allergies.  Home Medications   Current Outpatient Rx  Name  Route  Sig  Dispense  Refill  . albuterol (PROVENTIL) (5 MG/ML) 0.5% nebulizer solution   Nebulization   Take 0.5 mLs (2.5 mg total) by nebulization every 6 (six) hours as needed for wheezing.   20 mL   12   . PARoxetine (PAXIL) 10 MG tablet   Oral   Take 10 mg by mouth every morning.            BP 134/91  Pulse 66  Temp(Src) 97.9 F (36.6 C) (Oral)  Resp 14  Ht 6\' 2"  (1.88 m)  Wt 230 lb (104.327 kg)  BMI 29.52 kg/m2  SpO2 96% Physical Exam CONSTITUTIONAL: Well developed/well nourished HEAD: Normocephalic/atraumatic EYES: EOMI ENMT: Mucous membranes moist Right TM - clear and intact. Left ear- end of qtip noted in canal.  No blood noted  in canal NECK: supple no meningeal signs NEURO: Pt is awake/alert, moves all extremitiesx4 EXTREMITIES:  full ROM SKIN: warm, color normal   ED Course  FOREIGN BODY REMOVAL Date/Time: 04/27/2013 7:23 AM Performed by: Joya GaskinsWICKLINE, Antaeus Karel W Authorized by: Joya GaskinsWICKLINE, Ahmed Inniss W Patient identity confirmed: verbally with patient Patient sedated: no Patient cooperative: yes Complexity: simple Objects recovered: end of cotton swab Post-procedure assessment: foreign body removed Patient tolerance: Patient tolerated the procedure well with no immediate complications. Comments: End of cotton swab removed from left ear Pt tolerated well No bleeding noted in ear canal Left tympanic membrane intact after procedure     MDM   Final diagnoses:  Foreign body in left ear    Nursing notes including past medical history and social history reviewed and considered in documentation     Joya Gaskinsonald W Suhas Estis, MD 04/27/13 [phone]

## 2013-04-27 NOTE — ED Notes (Signed)
States was cleaning out L. ear yesterday morning with a cotton swab when a piece of the cotton broke off in it. Denies pain. States just has popping sensation.

## 2013-04-27 NOTE — Discharge Instructions (Signed)
PLEASE RETURN IF YOU HAVE ANY BLEEDING/DRAINAGE FROM YOUR EAR, OR ANY NEW HEADACHE, FEVER OR HEARING LOSS OVER NEXT 24-48 HOURS

## 2013-04-27 NOTE — ED Notes (Signed)
Patient with no complaints at this time. Respirations even and unlabored. Skin warm/dry. Discharge instructions reviewed with patient at this time. Patient given opportunity to voice concerns/ask questions. Patient discharged at this time and left Emergency Department with steady gait.   

## 2013-09-02 ENCOUNTER — Emergency Department (HOSPITAL_COMMUNITY): Payer: BC Managed Care – PPO

## 2013-09-02 ENCOUNTER — Encounter (HOSPITAL_COMMUNITY): Payer: Self-pay | Admitting: Emergency Medicine

## 2013-09-02 ENCOUNTER — Emergency Department (HOSPITAL_COMMUNITY)
Admission: EM | Admit: 2013-09-02 | Discharge: 2013-09-02 | Disposition: A | Discharge status: Home / Self Care | Payer: BC Managed Care – PPO | Attending: Emergency Medicine | Admitting: Emergency Medicine

## 2013-09-02 DIAGNOSIS — J45909 Unspecified asthma, uncomplicated: Secondary | ICD-10-CM | POA: Insufficient documentation

## 2013-09-02 DIAGNOSIS — F329 Major depressive disorder, single episode, unspecified: Secondary | ICD-10-CM | POA: Insufficient documentation

## 2013-09-02 DIAGNOSIS — Z711 Person with feared health complaint in whom no diagnosis is made: Secondary | ICD-10-CM | POA: Insufficient documentation

## 2013-09-02 DIAGNOSIS — R739 Hyperglycemia, unspecified: Secondary | ICD-10-CM

## 2013-09-02 DIAGNOSIS — F3289 Other specified depressive episodes: Secondary | ICD-10-CM | POA: Insufficient documentation

## 2013-09-02 DIAGNOSIS — Z0000 Encounter for general adult medical examination without abnormal findings: Secondary | ICD-10-CM

## 2013-09-02 DIAGNOSIS — Z87891 Personal history of nicotine dependence: Secondary | ICD-10-CM | POA: Insufficient documentation

## 2013-09-02 DIAGNOSIS — Z79899 Other long term (current) drug therapy: Secondary | ICD-10-CM | POA: Insufficient documentation

## 2013-09-02 DIAGNOSIS — R7309 Other abnormal glucose: Secondary | ICD-10-CM | POA: Insufficient documentation

## 2013-09-02 LAB — CBC WITH DIFFERENTIAL/PLATELET
BASOS ABS: 0 10*3/uL (ref 0.0–0.1)
BASOS PCT: 0 % (ref 0–1)
EOS ABS: 0.4 10*3/uL (ref 0.0–0.7)
EOS PCT: 6 % — AB (ref 0–5)
HEMATOCRIT: 39 % (ref 39.0–52.0)
Hemoglobin: 13.3 g/dL (ref 13.0–17.0)
LYMPHS PCT: 29 % (ref 12–46)
Lymphs Abs: 1.7 10*3/uL (ref 0.7–4.0)
MCH: 29.9 pg (ref 26.0–34.0)
MCHC: 34.1 g/dL (ref 30.0–36.0)
MCV: 87.6 fL (ref 78.0–100.0)
MONO ABS: 0.4 10*3/uL (ref 0.1–1.0)
Monocytes Relative: 7 % (ref 3–12)
Neutro Abs: 3.3 10*3/uL (ref 1.7–7.7)
Neutrophils Relative %: 58 % (ref 43–77)
Platelets: 193 10*3/uL (ref 150–400)
RBC: 4.45 MIL/uL (ref 4.22–5.81)
RDW: 13.4 % (ref 11.5–15.5)
WBC: 5.7 10*3/uL (ref 4.0–10.5)

## 2013-09-02 LAB — COMPREHENSIVE METABOLIC PANEL
ALT: 18 U/L (ref 0–53)
AST: 17 U/L (ref 0–37)
Albumin: 3.2 g/dL — ABNORMAL LOW (ref 3.5–5.2)
Alkaline Phosphatase: 82 U/L (ref 39–117)
Anion gap: 9 (ref 5–15)
BUN: 20 mg/dL (ref 6–23)
CALCIUM: 8.8 mg/dL (ref 8.4–10.5)
CO2: 25 meq/L (ref 19–32)
CREATININE: 1.13 mg/dL (ref 0.50–1.35)
Chloride: 106 mEq/L (ref 96–112)
GFR calc Af Amer: 79 mL/min — ABNORMAL LOW (ref >90)
GFR, EST NON AFRICAN AMERICAN: 68 mL/min — AB (ref >90)
Glucose, Bld: 173 mg/dL — ABNORMAL HIGH (ref 70–99)
Potassium: 3.9 mEq/L (ref 3.7–5.3)
Sodium: 140 mEq/L (ref 137–147)
Total Bilirubin: 0.2 mg/dL — ABNORMAL LOW (ref 0.3–1.2)
Total Protein: 5.8 g/dL — ABNORMAL LOW (ref 6.0–8.3)

## 2013-09-02 NOTE — Discharge Instructions (Signed)
Your blood sugar was a little high today. That needs to be followed closely - you may already be diabetic. Make an appointment as soon as possible to have this properly investigated and treated.

## 2013-09-02 NOTE — ED Notes (Signed)
Pt states he fells bloated in his abdomen. Did not feel well yesterday. Denies NVD.

## 2013-09-02 NOTE — ED Provider Notes (Addendum)
CSN: [id number]     Arrival date & time 09/02/13  0417 History   First MD Initiated Contact with Patient 09/02/13 0449     Chief Complaint  Patient presents with  . Bloated     (Consider location/radiation/quality/duration/timing/severity/associated sxs/prior Treatment) The history is provided by the patient.   63 year old male states that over last 2 days he has noted some swelling in his upper abdomen which he thought was unusual. He thought that the swelling should have been in the lower abdomen it was that. He generally has not felt well for the last 24 hours but does not have any specific complaints. He denies nausea or vomiting and denies change in appetite. He denies fever or chills. He denies constipation or diarrhea.  Past Medical History  Diagnosis Date  . Depression   . Asthma    Past Surgical History  Procedure Laterality Date  . Hand surgery    . Appendectomy    . Cholecystectomy     No family history on file. History  Substance Use Topics  . Smoking status: Former Games developermoker  . Smokeless tobacco: Not on file  . Alcohol Use: Yes     Comment: occasionally    Review of Systems  All other systems reviewed and are negative.     Allergies  Review of patient's allergies indicates no known allergies.  Home Medications   Prior to Admission medications   Medication Sig Start Date End Date Taking? Authorizing Provider  albuterol (PROVENTIL) (5 MG/ML) 0.5% nebulizer solution Take 0.5 mLs (2.5 mg total) by nebulization every 6 (six) hours as needed for wheezing. 01/27/12  Yes Annamarie Dawleyerry S Strand, MD  PARoxetine (PAXIL) 10 MG tablet Take 10 mg by mouth every morning.     Yes Historical Provider, MD   BP 139/82  Pulse 68  Temp(Src) 97.8 F (36.6 C) (Oral)  Resp 16  Ht 6\' 2"  (1.88 m)  Wt 230 lb (104.327 kg)  BMI 29.52 kg/m2  SpO2 95% Physical Exam  Nursing note and vitals reviewed.  63 year old male, resting comfortably and in no acute distress. Vital signs are  normal. Oxygen saturation is 95%, which is normal. Head is normocephalic and atraumatic. PERRLA, EOMI. Oropharynx is clear. There is no scleral icterus. Neck is nontender and supple without adenopathy or JVD. Back is nontender and there is no CVA tenderness. Lungs are clear without rales, wheezes, or rhonchi. Chest is nontender. Heart has regular rate and rhythm without murmur. Abdomen is soft, flat, nontender without masses and peristalsis is normoactive. Liver which is felt just distal to the costal margin but liver span is still within the normal range. Liver edge is nontender and smooth. Extremities have no cyanosis or edema, full range of motion is present. Skin is warm and dry without rash. Neurologic: Mental status is normal, cranial nerves are intact, there are no motor or sensory deficits.  ED Course  Procedures (including critical care time) Labs Review Results for orders placed during the hospital encounter of 09/02/13  CBC WITH DIFFERENTIAL      Result Value Ref Range   WBC 5.7  4.0 - 10.5 K/uL   RBC 4.45  4.22 - 5.81 MIL/uL   Hemoglobin 13.3  13.0 - 17.0 g/dL   HCT 21.339.0  08.639.0 - 57.852.0 %   MCV 87.6  78.0 - 100.0 fL   MCH 29.9  26.0 - 34.0 pg   MCHC 34.1  30.0 - 36.0 g/dL   RDW 46.913.4  62.911.5 -  15.5 %   Platelets 193  150 - 400 K/uL   Neutrophils Relative % 58  43 - 77 %   Neutro Abs 3.3  1.7 - 7.7 K/uL   Lymphocytes Relative 29  12 - 46 %   Lymphs Abs 1.7  0.7 - 4.0 K/uL   Monocytes Relative 7  3 - 12 %   Monocytes Absolute 0.4  0.1 - 1.0 K/uL   Eosinophils Relative 6 (*) 0 - 5 %   Eosinophils Absolute 0.4  0.0 - 0.7 K/uL   Basophils Relative 0  0 - 1 %   Basophils Absolute 0.0  0.0 - 0.1 K/uL  COMPREHENSIVE METABOLIC PANEL      Result Value Ref Range   Sodium 140  137 - 147 mEq/L   Potassium 3.9  3.7 - 5.3 mEq/L   Chloride 106  96 - 112 mEq/L   CO2 25  19 - 32 mEq/L   Glucose, Bld 173 (*) 70 - 99 mg/dL   BUN 20  6 - 23 mg/dL   Creatinine, Ser 1.61  0.50 - 1.35  mg/dL   Calcium 8.8  8.4 - 09.6 mg/dL   Total Protein 5.8 (*) 6.0 - 8.3 g/dL   Albumin 3.2 (*) 3.5 - 5.2 g/dL   AST 17  0 - 37 U/L   ALT 18  0 - 53 U/L   Alkaline Phosphatase 82  39 - 117 U/L   Total Bilirubin 0.2 (*) 0.3 - 1.2 mg/dL   GFR calc non Af Amer 68 (*) >90 mL/min   GFR calc Af Amer 79 (*) >90 mL/min   Anion gap 9  5 - 15  Imaging Review Dg Abd Acute W/chest  09/02/2013   CLINICAL DATA:  Mid abdominal swelling for 2 days.  EXAM: ACUTE ABDOMEN SERIES (ABDOMEN 2 VIEW & CHEST 1 VIEW)  COMPARISON:  01/27/2012  FINDINGS: Normal heart size and pulmonary vascularity. No focal airspace disease or consolidation in the lungs. No blunting of costophrenic angles. No pneumothorax.  Surgical clips in the right upper quadrant. Scattered gas and stool in the colon. No small or large bowel distention. No free intra-abdominal air. No abnormal air-fluid levels. No radiopaque stones. Degenerative changes in the spine and hips.  IMPRESSION: No evidence of active pulmonary disease. Nonobstructive bowel gas pattern.   Electronically Signed   By: Burman Nieves M.D.   On: 09/02/2013 06:04    MDM   Final diagnoses:  Normal physical exam  Hyperglycemia    Mild abdominal distention which is probably related to gas. Screening labs were obtained.  Laboratory workup is significant for hyperglycemia which will need to be followed as an outpatient. X-rays are unremarkable. Patient is informed of these findings.  Dione Booze, MD 09/02/13 0454  Dione Booze, MD 09/02/13 [phone]

## 2015-03-21 ENCOUNTER — Emergency Department (HOSPITAL_COMMUNITY)
Admission: EM | Admit: 2015-03-21 | Discharge: 2015-03-21 | Disposition: A | Discharge status: Home / Self Care | Payer: BLUE CROSS/BLUE SHIELD | Attending: Emergency Medicine | Admitting: Emergency Medicine

## 2015-03-21 ENCOUNTER — Emergency Department (HOSPITAL_COMMUNITY): Payer: BLUE CROSS/BLUE SHIELD

## 2015-03-21 ENCOUNTER — Encounter (HOSPITAL_COMMUNITY): Payer: Self-pay

## 2015-03-21 DIAGNOSIS — Z87891 Personal history of nicotine dependence: Secondary | ICD-10-CM | POA: Diagnosis not present

## 2015-03-21 DIAGNOSIS — J45901 Unspecified asthma with (acute) exacerbation: Secondary | ICD-10-CM | POA: Diagnosis not present

## 2015-03-21 DIAGNOSIS — F329 Major depressive disorder, single episode, unspecified: Secondary | ICD-10-CM | POA: Insufficient documentation

## 2015-03-21 DIAGNOSIS — Z79899 Other long term (current) drug therapy: Secondary | ICD-10-CM | POA: Insufficient documentation

## 2015-03-21 DIAGNOSIS — R0602 Shortness of breath: Secondary | ICD-10-CM | POA: Diagnosis present

## 2015-03-21 LAB — CBC WITH DIFFERENTIAL/PLATELET
BASOS ABS: 0 10*3/uL (ref 0.0–0.1)
Basophils Relative: 0 %
EOS ABS: 0.3 10*3/uL (ref 0.0–0.7)
EOS PCT: 3 %
HCT: 44.6 % (ref 39.0–52.0)
Hemoglobin: 14.8 g/dL (ref 13.0–17.0)
LYMPHS PCT: 12 %
Lymphs Abs: 1.2 10*3/uL (ref 0.7–4.0)
MCH: 28.8 pg (ref 26.0–34.0)
MCHC: 33.2 g/dL (ref 30.0–36.0)
MCV: 86.8 fL (ref 78.0–100.0)
Monocytes Absolute: 0.5 10*3/uL (ref 0.1–1.0)
Monocytes Relative: 5 %
Neutro Abs: 8.5 10*3/uL — ABNORMAL HIGH (ref 1.7–7.7)
Neutrophils Relative %: 80 %
PLATELETS: 231 10*3/uL (ref 150–400)
RBC: 5.14 MIL/uL (ref 4.22–5.81)
RDW: 13.3 % (ref 11.5–15.5)
WBC: 10.6 10*3/uL — AB (ref 4.0–10.5)

## 2015-03-21 LAB — BASIC METABOLIC PANEL
ANION GAP: 8 (ref 5–15)
BUN: 16 mg/dL (ref 6–20)
CO2: 24 mmol/L (ref 22–32)
Calcium: 8.6 mg/dL — ABNORMAL LOW (ref 8.9–10.3)
Chloride: 107 mmol/L (ref 101–111)
Creatinine, Ser: 1.1 mg/dL (ref 0.61–1.24)
GFR calc Af Amer: >60 mL/min (ref >60)
Glucose, Bld: 205 mg/dL — ABNORMAL HIGH (ref 65–99)
POTASSIUM: 3.8 mmol/L (ref 3.5–5.1)
SODIUM: 139 mmol/L (ref 135–145)

## 2015-03-21 MED ORDER — IPRATROPIUM-ALBUTEROL 0.5-2.5 (3) MG/3ML IN SOLN
3.0000 mL | Freq: Once | RESPIRATORY_TRACT | Status: DC
  Filled 2015-03-21 (×2): qty 3

## 2015-03-21 MED ORDER — METHYLPREDNISOLONE SODIUM SUCC 125 MG IJ SOLR
125.0000 mg | Freq: Once | INTRAMUSCULAR | Status: AC
  Administered 2015-03-21: 125 mg via INTRAVENOUS
  Filled 2015-03-21: qty 2

## 2015-03-21 MED ORDER — IPRATROPIUM BROMIDE 0.02 % IN SOLN
0.5000 mg | Freq: Once | RESPIRATORY_TRACT | Status: AC
  Administered 2015-03-21: 0.5 mg via RESPIRATORY_TRACT
  Filled 2015-03-21: qty 2.5

## 2015-03-21 MED ORDER — PREDNISONE 10 MG (21) PO TBPK: ORAL_TABLET | ORAL | Status: DC

## 2015-03-21 MED ORDER — ALBUTEROL (5 MG/ML) CONTINUOUS INHALATION SOLN
10.0000 mg/h | INHALATION_SOLUTION | Freq: Once | RESPIRATORY_TRACT | Status: AC
  Administered 2015-03-21: 10 mg/h via RESPIRATORY_TRACT
  Filled 2015-03-21: qty 20

## 2015-03-21 MED ORDER — ALBUTEROL SULFATE (2.5 MG/3ML) 0.083% IN NEBU
5.0000 mg | INHALATION_SOLUTION | RESPIRATORY_TRACT | Status: DC | PRN
  Administered 2015-03-21: 5 mg via RESPIRATORY_TRACT
  Filled 2015-03-21: qty 6

## 2015-03-21 NOTE — Discharge Instructions (Signed)

## 2015-03-21 NOTE — ED Provider Notes (Signed)
CSN: [id number]     Arrival date & time 03/21/15  1301 History   First MD Initiated Contact with Patient 03/21/15 1325     Chief Complaint  Patient presents with  . Shortness of Breath   HPI Patient presents to the emergency room with complaints of shortness of breath. Symptoms initially started with a mild cold. The last couple of days he's had worsening shortness of breath. Patient does have a history of asthma and has been wheezing. He has been using his inhaler more frequently. This morning when he woke up it was much more severe. He became short of breath with just walking around the house. He was wheezing significantly. He denies any trouble with any chest pain. He denies any leg swelling. Past Medical History  Diagnosis Date  . Depression   . Asthma    Past Surgical History  Procedure Laterality Date  . Hand surgery    . Appendectomy    . Cholecystectomy     History reviewed. No pertinent family history. Social History  Substance Use Topics  . Smoking status: Former Games developer  . Smokeless tobacco: None  . Alcohol Use: Yes     Comment: occasionally    Review of Systems    Allergies  Review of patient's allergies indicates no known allergies.  Home Medications   Prior to Admission medications   Medication Sig Start Date End Date Taking? Authorizing Provider  albuterol (PROVENTIL) (5 MG/ML) 0.5% nebulizer solution Take 0.5 mLs (2.5 mg total) by nebulization every 6 (six) hours as needed for wheezing. 01/27/12  Yes Annamarie Dawley, MD  ALPRAZolam Prudy Feeler) 0.5 MG tablet Take 1 tablet by mouth daily as needed for anxiety.  12/23/14  Yes Historical Provider, MD  PARoxetine (PAXIL) 20 MG tablet Take 1 tablet by mouth daily. 03/08/15  Yes Historical Provider, MD  phenol (CHLORASEPTIC) 1.4 % LIQD Use as directed 1 spray in the mouth or throat as needed for throat irritation / pain.   Yes Historical Provider, MD  PROAIR HFA [phone] Base) MCG/ACT inhaler Inhale 2 puffs into the lungs  every 4 (four) hours as needed for wheezing or shortness of breath.  03/09/15  Yes Historical Provider, MD  Pseudoeph-Doxylamine-DM-APAP (NYQUIL PO) Take 30 mLs by mouth every 6 (six) hours as needed (cough/congestion/pain).   Yes Historical Provider, MD  predniSONE (STERAPRED UNI-PAK 21 TAB) 10 MG (21) TBPK tablet Take 6 tabs by mouth daily  for 2 days, then 5 tabs for 2 days, then 4 tabs for 2 days, then 3 tabs for 2 days, 2 tabs for 2 days, then 1 tab by mouth daily for 2 days 03/21/15   Linwood Dibbles, MD   BP 119/102 mmHg  Pulse 92  Resp 20  SpO2 92% Physical Exam  Constitutional: He appears well-developed and well-nourished. No distress.  HENT:  Head: Normocephalic and atraumatic.  Right Ear: External ear normal.  Left Ear: External ear normal.  Eyes: Conjunctivae are normal. Right eye exhibits no discharge. Left eye exhibits no discharge. No scleral icterus.  Neck: Neck supple. No tracheal deviation present.  Cardiovascular: Normal rate, regular rhythm and intact distal pulses.   Pulmonary/Chest: Accessory muscle usage present. No stridor. Tachypnea noted. No respiratory distress. He has wheezes. He has no rales.  Abdominal: Soft. Bowel sounds are normal. He exhibits no distension. There is no tenderness. There is no rebound and no guarding.  Musculoskeletal: He exhibits no edema or tenderness.  Neurological: He is alert. He has normal strength.  No cranial nerve deficit (no facial droop, extraocular movements intact, no slurred speech) or sensory deficit. He exhibits normal muscle tone. He displays no seizure activity. Coordination normal.  Skin: Skin is warm and dry. No rash noted.  Psychiatric: He has a normal mood and affect.  Nursing note and vitals reviewed.   ED Course  Procedures (including critical care time) Labs Review Labs Reviewed  CBC WITH DIFFERENTIAL/PLATELET - Abnormal; Notable for the following:    WBC 10.6 (*)    Neutro Abs 8.5 (*)    All other components within  normal limits  BASIC METABOLIC PANEL - Abnormal; Notable for the following:    Glucose, Bld 205 (*)    Calcium 8.6 (*)    All other components within normal limits    Imaging Review Dg Chest 2 View  03/21/2015  CLINICAL DATA:  65 year old with current history of asthma presenting with acute exacerbation with shortness of breath and hypoxia. EXAM: CHEST  2 VIEW COMPARISON:  09/02/2013 and earlier. FINDINGS: Cardiomediastinal silhouette unremarkable, unchanged. Lungs clear. Bronchovascular markings normal. Pulmonary vascularity normal. No visible pleural effusions. No pneumothorax. Mild eventration of the right hemidiaphragm as noted previously. Mild degenerative changes involving the thoracic spine. IMPRESSION: No acute cardiopulmonary disease.  Stable examination. Electronically Signed   By: Hulan Saas M.D.   On: 03/21/2015 15:38   I have personally reviewed and evaluated these images and lab results as part of my medical decision-making.  Medications  ipratropium-albuterol (DUONEB) 0.5-2.5 (3) MG/3ML nebulizer solution 3 mL (3 mLs Nebulization Not Given 03/21/15 1330)  albuterol (PROVENTIL) (2.5 MG/3ML) 0.083% nebulizer solution 5 mg (5 mg Nebulization Given 03/21/15 1552)  methylPREDNISolone sodium succinate (SOLU-MEDROL) 125 mg/2 mL injection 125 mg (125 mg Intravenous Given 03/21/15 1349)  albuterol (PROVENTIL,VENTOLIN) solution continuous neb (10 mg/hr Nebulization Given 03/21/15 1351)  ipratropium (ATROVENT) nebulizer solution 0.5 mg (0.5 mg Nebulization Given 03/21/15 1351)   1545  Still wheezing.  Persistent oxygen requirement.    Will repeat neb and reasses 1723  wheezing decreased. Patient's oxygen saturation is in the low 90s. He is able to walk around the emergency room without hypoxia. MDM   Final diagnoses:  Acute asthma exacerbation, unspecified asthma severity    Patient presented to the emergency room with acute wheezing associated with an asthma attack. Patient was  treated with albuterol nebulizer treatments and steroids. He had significant improvement and feels ready to go home at this point. Plan will be to discharge him home on a course of steroids. His albuterol inhalers at home. Follow up with his primary doctor to get rechecked    Linwood Dibbles, MD 03/21/15 1724

## 2015-03-21 NOTE — ED Notes (Signed)
Continuous neb complete. Pt to xray. NAD. Pt as been resting well with eyes closed.

## 2015-03-21 NOTE — ED Notes (Signed)
Pt tolerated ambulation well. No complaints after walk o2 was 93

## 2015-03-21 NOTE — ED Notes (Signed)
Pts SPO2 decreased to 84% after walking from triage room to ED Room 3, patient placed on 2L/[place] states increased to 90%

## 2015-03-21 NOTE — ED Notes (Signed)
Known asthmatic, recent cold symptoms. Increased SOB over the last couple of days, used inhaler multiple times and nebulzier twice last time one hour ago with no relief. 89% on RA, accessory muscle use and dyspnea at rest

## 2016-03-04 ENCOUNTER — Inpatient Hospital Stay (HOSPITAL_COMMUNITY)
Admission: EM | Admit: 2016-03-04 | Discharge: 2016-03-06 | DRG: 189 | Disposition: A | Discharge status: Home / Self Care | Payer: BLUE CROSS/BLUE SHIELD | Attending: Internal Medicine | Admitting: Internal Medicine

## 2016-03-04 ENCOUNTER — Encounter (HOSPITAL_COMMUNITY): Payer: Self-pay | Admitting: Emergency Medicine

## 2016-03-04 ENCOUNTER — Emergency Department (HOSPITAL_COMMUNITY): Payer: BLUE CROSS/BLUE SHIELD

## 2016-03-04 DIAGNOSIS — J9601 Acute respiratory failure with hypoxia: Secondary | ICD-10-CM | POA: Diagnosis present

## 2016-03-04 DIAGNOSIS — Z7951 Long term (current) use of inhaled steroids: Secondary | ICD-10-CM

## 2016-03-04 DIAGNOSIS — E876 Hypokalemia: Secondary | ICD-10-CM | POA: Diagnosis present

## 2016-03-04 DIAGNOSIS — J441 Chronic obstructive pulmonary disease with (acute) exacerbation: Secondary | ICD-10-CM | POA: Diagnosis present

## 2016-03-04 DIAGNOSIS — Z9889 Other specified postprocedural states: Secondary | ICD-10-CM

## 2016-03-04 DIAGNOSIS — Z79899 Other long term (current) drug therapy: Secondary | ICD-10-CM

## 2016-03-04 DIAGNOSIS — N179 Acute kidney failure, unspecified: Secondary | ICD-10-CM | POA: Diagnosis present

## 2016-03-04 DIAGNOSIS — J449 Chronic obstructive pulmonary disease, unspecified: Secondary | ICD-10-CM

## 2016-03-04 DIAGNOSIS — Z9049 Acquired absence of other specified parts of digestive tract: Secondary | ICD-10-CM

## 2016-03-04 DIAGNOSIS — E860 Dehydration: Secondary | ICD-10-CM | POA: Diagnosis present

## 2016-03-04 DIAGNOSIS — Z87891 Personal history of nicotine dependence: Secondary | ICD-10-CM

## 2016-03-04 DIAGNOSIS — E875 Hyperkalemia: Secondary | ICD-10-CM | POA: Diagnosis present

## 2016-03-04 HISTORY — DX: Chronic obstructive pulmonary disease, unspecified: J44.9

## 2016-03-04 MED ORDER — ALBUTEROL (5 MG/ML) CONTINUOUS INHALATION SOLN
INHALATION_SOLUTION | RESPIRATORY_TRACT | Status: AC
  Administered 2016-03-04: 22:00:00
  Filled 2016-03-04: qty 20

## 2016-03-04 MED ORDER — IPRATROPIUM BROMIDE 0.02 % IN SOLN
RESPIRATORY_TRACT | Status: AC
  Filled 2016-03-04: qty 2.5

## 2016-03-04 MED ORDER — ALBUTEROL SULFATE (2.5 MG/3ML) 0.083% IN NEBU
5.0000 mg | INHALATION_SOLUTION | Freq: Once | RESPIRATORY_TRACT | Status: AC
  Administered 2016-03-04: 5 mg via RESPIRATORY_TRACT
  Filled 2016-03-04: qty 6

## 2016-03-04 MED ORDER — IPRATROPIUM BROMIDE 0.02 % IN SOLN
RESPIRATORY_TRACT | Status: AC
  Administered 2016-03-04: 0.5 mg
  Filled 2016-03-04: qty 2.5

## 2016-03-04 MED ORDER — IPRATROPIUM BROMIDE 0.02 % IN SOLN
0.5000 mg | Freq: Once | RESPIRATORY_TRACT | Status: AC
  Administered 2016-03-04: 0.5 mg via RESPIRATORY_TRACT

## 2016-03-04 MED ORDER — METHYLPREDNISOLONE SODIUM SUCC 125 MG IJ SOLR
125.0000 mg | Freq: Once | INTRAMUSCULAR | Status: AC
  Administered 2016-03-04: 125 mg via INTRAVENOUS
  Filled 2016-03-04: qty 2

## 2016-03-04 NOTE — ED Triage Notes (Signed)
COPD with recent coughing up brown expectorant

## 2016-03-04 NOTE — ED Notes (Signed)
Ambulated Pt Pt walked fine seemed to feel okay & wasn't short of breath

## 2016-03-04 NOTE — ED Provider Notes (Signed)
[facility]-EMERGENCY DEPT Provider Note   CSN: [id number] Arrival date & time: 03/04/16  1931  By signing my name below, I, Alyssa Grove, attest that this documentation has been prepared under the direction and in the presence of Cheron Schaumann, [place]. Electronically Signed: Alyssa Grove, ED Scribe. 03/04/16. 8:39 PM.  History   Chief Complaint Chief Complaint  Patient presents with  . Shortness of Breath    COPD asthma   The history is provided by the patient. No language interpreter was used.    HPI Comments: [name] is a 66 y.o. male with PMHx of COPD who presents to the Emergency Department complaining of gradual onset, constant, moderate, unchanged shortness of breath for 2 days. He reports associated chest congestion and cough producing brown sputum.  He states the cough and chest congestion have exacerbated his shortness of breath.  He denies PMHx of DM and HTN.   Past Medical History:  Diagnosis Date  . Asthma   . COPD (chronic obstructive pulmonary disease) (HCC)   . Depression     There are no active problems to display for this patient.   Past Surgical History:  Procedure Laterality Date  . APPENDECTOMY    . CHOLECYSTECTOMY    . HAND SURGERY         Home Medications    Prior to Admission medications   Medication Sig Start Date End Date Taking? Authorizing Provider  albuterol (PROVENTIL) (5 MG/ML) 0.5% nebulizer solution Take 0.5 mLs (2.5 mg total) by nebulization every 6 (six) hours as needed for wheezing. 01/27/12   Annamarie Dawley, MD  ALPRAZolam Prudy Feeler) 0.5 MG tablet Take 1 tablet by mouth daily as needed for anxiety.  12/23/14   Historical Provider, MD  PARoxetine (PAXIL) 20 MG tablet Take 1 tablet by mouth daily. 03/08/15   Historical Provider, MD  phenol (CHLORASEPTIC) 1.4 % LIQD Use as directed 1 spray in the mouth or throat as needed for throat irritation / pain.    Historical Provider, MD  predniSONE (STERAPRED UNI-PAK 21 TAB) 10 MG (21) TBPK tablet  Take 6 tabs by mouth daily  for 2 days, then 5 tabs for 2 days, then 4 tabs for 2 days, then 3 tabs for 2 days, 2 tabs for 2 days, then 1 tab by mouth daily for 2 days 03/21/15   Linwood Dibbles, MD  [facility] HFA 108 [phone] Base) MCG/ACT inhaler Inhale 2 puffs into the lungs every 4 (four) hours as needed for wheezing or shortness of breath.  03/09/15   Historical Provider, MD  Pseudoeph-Doxylamine-DM-APAP (NYQUIL PO) Take 30 mLs by mouth every 6 (six) hours as needed (cough/congestion/pain).    Historical Provider, MD    Family History No family history on file.  Social History Social History  Substance Use Topics  . Smoking status: Former Games developer  . Smokeless tobacco: Former Neurosurgeon  . Alcohol use Yes     Comment: occasionally     Allergies   Patient has no known allergies.   Review of Systems Review of Systems  Constitutional: Negative for fever.  HENT: Positive for congestion.   Respiratory: Positive for cough and shortness of breath.   All other systems reviewed and are negative.    Physical Exam Updated Vital Signs BP 138/71 (BP Location: Left Arm)   Pulse 68   Temp 98.8 F (37.1 C) (Oral)   Resp 20   Ht 6\' 2"  (1.88 m)   Wt 225 lb (102.1 kg)   SpO2 92%  BMI 28.89 kg/m   Physical Exam  Constitutional: He is oriented to person, place, and time. He appears well-developed and well-nourished. He is active. No distress.  HENT:  Head: Normocephalic and atraumatic.  Eyes: Conjunctivae are normal.  Cardiovascular: Normal rate.   Pulmonary/Chest: Effort normal. No respiratory distress. He has wheezes.  Wheezes in all lobes  Musculoskeletal: Normal range of motion.  Neurological: He is alert and oriented to person, place, and time.  Skin: Skin is warm and dry.  Psychiatric: He has a normal mood and affect. His behavior is normal.  Nursing note and vitals reviewed.   ED Treatments / Results  DIAGNOSTIC STUDIES: Oxygen Saturation is 92% on RA, low by my interpretation.     COORDINATION OF CARE: 8:37 PM Discussed treatment plan with pt at bedside which includes breathing treatment and pt agreed to plan.  Labs (all labs ordered are listed, but only abnormal results are displayed) Labs Reviewed - No data to display  EKG  EKG Interpretation None       Radiology Dg Chest 2 View  Result Date: 03/04/2016 CLINICAL DATA:  Productive cough EXAM: CHEST  2 VIEW COMPARISON:  03/21/2015 FINDINGS: The heart size and mediastinal contours are within normal limits. Both lungs are clear. The visualized skeletal structures are unremarkable. IMPRESSION: No active cardiopulmonary disease. Electronically Signed   By: Alcide CleverMark  Lukens M.D.   On: 03/04/2016 19:59    Procedures Procedures (including critical care time)  Medications Ordered in ED Medications  albuterol (PROVENTIL) (2.5 MG/3ML) 0.083% nebulizer solution 5 mg (5 mg Nebulization Given 03/04/16 2032)     Initial Impression / Assessment and Plan / ED Course  I have reviewed the triage vital signs and the nursing notes.  Pertinent labs & imaging results that were available during my care of the patient were reviewed by me and considered in my medical decision making (see chart for details).   Pt given albuterol 2.5 mg neb and atrovent neb.  Pt had continued wheezing.  Pt given 1 hour continuous neb and solumedrol Iv.   Pt had continued wheezing and 02 sats decreased with walking. 02 stats without 02 81%   (labs have been delayed due to short staff in lab)   Final Clinical Impressions(s) / ED Diagnoses   Final diagnoses:  COPD exacerbation (HCC)    New Prescriptions New Prescriptions   No medications on file   Consult to hospitalist for admission for exacerbation of copd.  Dr. Onalee Huaavid will admit    Elson AreasLeslie K Taraann Olthoff, PA-C 03/05/16 0109    Elson AreasLeslie K Camila [place], PA-C 03/05/16 0122    Samuel JesterKathleen McManus, DO 03/08/16 1346

## 2016-03-04 NOTE — ED Notes (Signed)
Pt O2 dropped to 81% on RA while talking with the PA.  Applied nasal canula with 2L O2

## 2016-03-05 ENCOUNTER — Encounter (HOSPITAL_COMMUNITY): Payer: Self-pay | Admitting: Family Medicine

## 2016-03-05 DIAGNOSIS — J449 Chronic obstructive pulmonary disease, unspecified: Secondary | ICD-10-CM | POA: Diagnosis present

## 2016-03-05 DIAGNOSIS — J9601 Acute respiratory failure with hypoxia: Principal | ICD-10-CM

## 2016-03-05 DIAGNOSIS — J441 Chronic obstructive pulmonary disease with (acute) exacerbation: Secondary | ICD-10-CM

## 2016-03-05 LAB — BASIC METABOLIC PANEL
Anion gap: 13 (ref 5–15)
BUN: 17 mg/dL (ref 6–20)
CHLORIDE: 100 mmol/L — AB (ref 101–111)
CO2: 24 mmol/L (ref 22–32)
CREATININE: 1.3 mg/dL — AB (ref 0.61–1.24)
Calcium: 8.8 mg/dL — ABNORMAL LOW (ref 8.9–10.3)
GFR calc non Af Amer: 56 mL/min — ABNORMAL LOW (ref >60)
GLUCOSE: 215 mg/dL — AB (ref 65–99)
Potassium: 3.1 mmol/L — ABNORMAL LOW (ref 3.5–5.1)
Sodium: 137 mmol/L (ref 135–145)

## 2016-03-05 LAB — CBC WITH DIFFERENTIAL/PLATELET
BASOS ABS: 0 10*3/uL (ref 0.0–0.1)
BASOS PCT: 0 %
EOS ABS: 0.2 10*3/uL (ref 0.0–0.7)
Eosinophils Relative: 2 %
HCT: 36.8 % — ABNORMAL LOW (ref 39.0–52.0)
HEMOGLOBIN: 12.3 g/dL — AB (ref 13.0–17.0)
LYMPHS ABS: 1.3 10*3/uL (ref 0.7–4.0)
Lymphocytes Relative: 19 %
MCH: 28 pg (ref 26.0–34.0)
MCHC: 33.4 g/dL (ref 30.0–36.0)
MCV: 83.8 fL (ref 78.0–100.0)
Monocytes Absolute: 0.8 10*3/uL (ref 0.1–1.0)
Monocytes Relative: 11 %
NEUTROS PCT: 68 %
Neutro Abs: 4.6 10*3/uL (ref 1.7–7.7)
PLATELETS: 284 10*3/uL (ref 150–400)
RBC: 4.39 MIL/uL (ref 4.22–5.81)
RDW: 12.9 % (ref 11.5–15.5)
WBC: 6.9 10*3/uL (ref 4.0–10.5)

## 2016-03-05 LAB — COMPREHENSIVE METABOLIC PANEL
ALBUMIN: 3.6 g/dL (ref 3.5–5.0)
ALK PHOS: 91 U/L (ref 38–126)
ALT: 19 U/L (ref 17–63)
AST: 23 U/L (ref 15–41)
Anion gap: 9 (ref 5–15)
BUN: 17 mg/dL (ref 6–20)
CALCIUM: 8.8 mg/dL — AB (ref 8.9–10.3)
CO2: 30 mmol/L (ref 22–32)
CREATININE: 1.12 mg/dL (ref 0.61–1.24)
Chloride: 99 mmol/L — ABNORMAL LOW (ref 101–111)
GFR calc Af Amer: >60 mL/min (ref >60)
GFR calc non Af Amer: >60 mL/min (ref >60)
GLUCOSE: 112 mg/dL — AB (ref 65–99)
Potassium: 4.1 mmol/L (ref 3.5–5.1)
SODIUM: 138 mmol/L (ref 135–145)
Total Bilirubin: 0.7 mg/dL (ref 0.3–1.2)
Total Protein: 6.8 g/dL (ref 6.5–8.1)

## 2016-03-05 LAB — CBC
HCT: 37.6 % — ABNORMAL LOW (ref 39.0–52.0)
HEMOGLOBIN: 12.3 g/dL — AB (ref 13.0–17.0)
MCH: 27.5 pg (ref 26.0–34.0)
MCHC: 32.7 g/dL (ref 30.0–36.0)
MCV: 84.1 fL (ref 78.0–100.0)
PLATELETS: 319 10*3/uL (ref 150–400)
RBC: 4.47 MIL/uL (ref 4.22–5.81)
RDW: 13 % (ref 11.5–15.5)
WBC: 6.6 10*3/uL (ref 4.0–10.5)

## 2016-03-05 MED ORDER — INDACATEROL-GLYCOPYRROLATE 27.5-15.6 MCG IN CAPS: 1.0000 | ORAL_CAPSULE | Freq: Every day | RESPIRATORY_TRACT | Status: DC

## 2016-03-05 MED ORDER — IPRATROPIUM-ALBUTEROL 0.5-2.5 (3) MG/3ML IN SOLN
3.0000 mL | RESPIRATORY_TRACT | Status: DC
  Administered 2016-03-05 – 2016-03-06 (×7): 3 mL via RESPIRATORY_TRACT
  Filled 2016-03-05 (×7): qty 3

## 2016-03-05 MED ORDER — SODIUM CHLORIDE 0.9 % IV SOLN: 250.0000 mL | INTRAVENOUS | Status: DC | PRN

## 2016-03-05 MED ORDER — ALPRAZOLAM 0.25 MG PO TABS
0.2500 mg | ORAL_TABLET | Freq: Once | ORAL | Status: AC
  Administered 2016-03-05: 0.25 mg via ORAL
  Filled 2016-03-05: qty 1

## 2016-03-05 MED ORDER — ALBUTEROL SULFATE (2.5 MG/3ML) 0.083% IN NEBU
3.0000 mL | INHALATION_SOLUTION | RESPIRATORY_TRACT | Status: DC | PRN
  Administered 2016-03-05: 3 mL via RESPIRATORY_TRACT

## 2016-03-05 MED ORDER — POTASSIUM CHLORIDE CRYS ER 20 MEQ PO TBCR
40.0000 meq | EXTENDED_RELEASE_TABLET | Freq: Four times a day (QID) | ORAL | Status: AC
  Administered 2016-03-05 (×2): 40 meq via ORAL
  Filled 2016-03-05 (×2): qty 2

## 2016-03-05 MED ORDER — SODIUM CHLORIDE 0.9% FLUSH
3.0000 mL | Freq: Two times a day (BID) | INTRAVENOUS | Status: DC
  Administered 2016-03-05 (×2): 3 mL via INTRAVENOUS

## 2016-03-05 MED ORDER — SODIUM CHLORIDE 0.9 % IV SOLN
250.0000 mL | INTRAVENOUS | Status: DC
  Administered 2016-03-05: 250 mL via INTRAVENOUS

## 2016-03-05 MED ORDER — ALBUTEROL SULFATE (2.5 MG/3ML) 0.083% IN NEBU
2.5000 mg | INHALATION_SOLUTION | Freq: Once | RESPIRATORY_TRACT | Status: AC
  Administered 2016-03-05: 2.5 mg via RESPIRATORY_TRACT
  Filled 2016-03-05: qty 3

## 2016-03-05 MED ORDER — PAROXETINE HCL 20 MG PO TABS
20.0000 mg | ORAL_TABLET | Freq: Every day | ORAL | Status: DC
  Administered 2016-03-05 – 2016-03-06 (×2): 20 mg via ORAL
  Filled 2016-03-05 (×2): qty 1

## 2016-03-05 MED ORDER — AZITHROMYCIN 250 MG PO TABS
250.0000 mg | ORAL_TABLET | Freq: Every day | ORAL | Status: DC
  Administered 2016-03-06: 250 mg via ORAL
  Filled 2016-03-05: qty 1

## 2016-03-05 MED ORDER — INDOMETHACIN 25 MG PO CAPS
50.0000 mg | ORAL_CAPSULE | Freq: Every day | ORAL | Status: DC
  Administered 2016-03-05: 50 mg via ORAL
  Filled 2016-03-05: qty 2

## 2016-03-05 MED ORDER — IPRATROPIUM BROMIDE 0.02 % IN SOLN
0.5000 mg | Freq: Four times a day (QID) | RESPIRATORY_TRACT | Status: DC
  Administered 2016-03-05: 0.5 mg via RESPIRATORY_TRACT
  Filled 2016-03-05: qty 2.5

## 2016-03-05 MED ORDER — BUDESONIDE 0.25 MG/2ML IN SUSP
0.2500 mg | Freq: Two times a day (BID) | RESPIRATORY_TRACT | Status: DC
  Administered 2016-03-05 – 2016-03-06 (×3): 0.25 mg via RESPIRATORY_TRACT
  Filled 2016-03-05 (×3): qty 2

## 2016-03-05 MED ORDER — ENOXAPARIN SODIUM 40 MG/0.4ML ~~LOC~~ SOLN
40.0000 mg | SUBCUTANEOUS | Status: DC
  Administered 2016-03-05 – 2016-03-06 (×2): 40 mg via SUBCUTANEOUS
  Filled 2016-03-05 (×2): qty 0.4

## 2016-03-05 MED ORDER — AZITHROMYCIN 250 MG PO TABS
500.0000 mg | ORAL_TABLET | Freq: Every day | ORAL | Status: AC
  Administered 2016-03-05: 500 mg via ORAL
  Filled 2016-03-05: qty 2

## 2016-03-05 MED ORDER — SODIUM CHLORIDE 0.9% FLUSH
3.0000 mL | INTRAVENOUS | Status: DC | PRN
  Administered 2016-03-06: 3 mL via INTRAVENOUS
  Filled 2016-03-05: qty 3

## 2016-03-05 MED ORDER — BECLOMETHASONE DIPROPIONATE 80 MCG/ACT IN AERS
1.0000 | INHALATION_SPRAY | Freq: Two times a day (BID) | RESPIRATORY_TRACT | Status: DC
  Filled 2016-03-05: qty 8.7

## 2016-03-05 MED ORDER — FUROSEMIDE 20 MG PO TABS
20.0000 mg | ORAL_TABLET | Freq: Every day | ORAL | Status: DC
Start: 2016-03-05 — End: 2016-03-05

## 2016-03-05 MED ORDER — UMECLIDINIUM-VILANTEROL 62.5-25 MCG/INH IN AEPB
1.0000 | INHALATION_SPRAY | Freq: Every day | RESPIRATORY_TRACT | Status: DC
  Administered 2016-03-05 – 2016-03-06 (×2): 1 via RESPIRATORY_TRACT
  Filled 2016-03-05: qty 14

## 2016-03-05 MED ORDER — ALBUTEROL SULFATE (2.5 MG/3ML) 0.083% IN NEBU
2.5000 mg | INHALATION_SOLUTION | RESPIRATORY_TRACT | Status: DC | PRN
  Filled 2016-03-05: qty 3

## 2016-03-05 MED ORDER — METHYLPREDNISOLONE SODIUM SUCC 125 MG IJ SOLR
80.0000 mg | Freq: Two times a day (BID) | INTRAMUSCULAR | Status: DC
  Administered 2016-03-05 – 2016-03-06 (×3): 80 mg via INTRAVENOUS
  Filled 2016-03-05 (×3): qty 2

## 2016-03-05 MED ORDER — PHENOL 1.4 % MT LIQD
1.0000 | OROMUCOSAL | Status: DC | PRN
Start: 2016-03-05 — End: 2016-03-06
  Administered 2016-03-05: 1 via OROMUCOSAL
  Filled 2016-03-05: qty 177

## 2016-03-05 NOTE — Progress Notes (Signed)
Increased nebs back to Q4 as patient is feeling very tight and dyspneic. Oxygen increased to 4 lpm.

## 2016-03-05 NOTE — Progress Notes (Signed)
Informed pt that we do not have Indacaterol-Glycopyrrolate.  States that [place] and there is no one else to bring med from home.  States that this is a new med, just started 4-5 days ago.

## 2016-03-05 NOTE — Progress Notes (Signed)
PROGRESS NOTE                                                                                                                                                                                                             Patient Demographics:    [name], is a 66 y.o. male, DOB - February 27, 1950, WUJ:[id number]  Admit date - 03/04/2016   Admitting Physician Haydee Monica, MD  Outpatient Primary MD for the patient is [name], MD  LOS - 0  Chief Complaint  Patient presents with  . Shortness of Breath    COPD asthma       Brief Narrative -   [name] is a 66 y.o. male with medical history significant of copd not on supplemental oxygen at home comes in with several days of uri symptoms with associated sob, wheezing and cough.  He denies any fevers.  His wheezing has gotten worse, so came to ED where he was hypoxic and diffusely wheezing.  He is improved with nebs, solumedrol and supplemental oxgyen.  Pt was ambulated and his o2 dropped to the low 80s so was therefore referred for admission.  He is feeling better so far.   Subjective:    Carles Collet today has, No headache, No chest pain, No abdominal pain - No Nausea, No new weakness tingling or numbness, improved Cough - SOB.     Assessment  & Plan :     1.COPD Exacerbation. Improving with IV steroids, azithromycin, oxygen and nebulizer treatment. All will be continued. We'll try to ambulate and check for home oxygen qualification. If better discharge in the morning.  2. ARF. Mild. Likely due to dehydration, gentle hydration. BMP in the morning.  3. Hypokalemia. Replaced.    Family Communication  :  None  Code Status :  Full  Diet : Diet Heart Room service appropriate? Yes; Fluid consistency: Thin   Disposition Plan  :  Home 1-2 days  Consults  :  None  Procedures  :  None  DVT Prophylaxis  :  Lovenox ordered  Lab Results  Component Value  Date   PLT 319 03/05/2016    Inpatient Medications  Scheduled Meds: . azithromycin  500 mg Oral Daily   Followed by  . [START ON 03/06/2016] azithromycin  250 mg Oral Daily  . budesonide (PULMICORT)  nebulizer solution  0.25 mg Nebulization BID  . furosemide  20 mg Oral Daily  . Indacaterol-Glycopyrrolate  1 puff Inhalation Daily  . indomethacin  50 mg Oral Daily  . ipratropium-albuterol  3 mL Nebulization Q4H WA  . methylPREDNISolone (SOLU-MEDROL) injection  80 mg Intravenous Q12H  . PARoxetine  20 mg Oral Daily  . potassium chloride  40 mEq Oral Q6H  . sodium chloride flush  3 mL Intravenous Q12H  . umeclidinium-vilanterol  1 puff Inhalation Daily   Continuous Infusions: PRN Meds:.sodium chloride, albuterol, phenol, sodium chloride flush  Antibiotics  :    Anti-infectives    Start     Dose/Rate Route Frequency Ordered Stop   03/06/16 1000  azithromycin (ZITHROMAX) tablet 250 mg     250 mg Oral Daily 03/05/16 0202 03/10/16 0959   03/05/16 1000  azithromycin (ZITHROMAX) tablet 500 mg     500 mg Oral Daily 03/05/16 0202 03/06/16 0959         Objective:   Vitals:   03/05/16 0408 03/05/16 0412 03/05/16 0610 03/05/16 0834  BP:   (!) 124/58   Pulse: 85  87   Resp: 20  20   Temp:   98.9 F (37.2 C)   TempSrc:   Oral   SpO2: (!) 88% 91% 96% (!) 88%  Weight:      Height:        Wt Readings from Last 3 Encounters:  03/05/16 101.8 kg (224 lb 8 oz)  09/02/13 104.3 kg (230 lb)  04/27/13 104.3 kg (230 lb)     Intake/Output Summary (Last 24 hours) at 03/05/16 0847 Last data filed at 03/05/16 4098  Gross per 24 hour  Intake                0 ml  Output              200 ml  Net             -200 ml     Physical Exam  Awake Alert, Oriented X 3, No new F.N deficits, Normal affect [place].AT,PERRAL Supple Neck,No JVD, No cervical lymphadenopathy appriciated.  Symmetrical Chest wall movement, Mod air movement bilaterally, +ve wheezing RRR,No Gallops,Rubs or new Murmurs, No  Parasternal Heave +ve B.Sounds, Abd Soft, No tenderness, No organomegaly appriciated, No rebound - guarding or rigidity. No Cyanosis, Clubbing or edema, No new Rash or bruise      Data Review:    CBC  Recent Labs Lab 03/04/16 2053 03/05/16 0623  WBC 6.9 6.6  HGB 12.3* 12.3*  HCT 36.8* 37.6*  PLT 284 319  MCV 83.8 84.1  MCH 28.0 27.5  MCHC 33.4 32.7  RDW 12.9 13.0  LYMPHSABS 1.3  --   MONOABS 0.8  --   EOSABS 0.2  --   BASOSABS 0.0  --     Chemistries   Recent Labs Lab 03/04/16 2053 03/05/16 0623  NA 138 137  K 4.1 3.1*  CL 99* 100*  CO2 30 24  GLUCOSE 112* 215*  BUN 17 17  CREATININE 1.12 1.30*  CALCIUM 8.8* 8.8*  AST 23  --   ALT 19  --   ALKPHOS 91  --   BILITOT 0.7  --    ------------------------------------------------------------------------------------------------------------------ No results for input(s): CHOL, HDL, LDLCALC, TRIG, CHOLHDL, LDLDIRECT in the last 72 hours.  No results found for: HGBA1C ------------------------------------------------------------------------------------------------------------------ No results for input(s): TSH, T4TOTAL, T3FREE, THYROIDAB in the last 72 hours.  Invalid input(s): FREET3 ------------------------------------------------------------------------------------------------------------------  No results for input(s): VITAMINB12, FOLATE, FERRITIN, TIBC, IRON, RETICCTPCT in the last 72 hours.  Coagulation profile No results for input(s): INR, PROTIME in the last 168 hours.  No results for input(s): DDIMER in the last 72 hours.  Cardiac Enzymes No results for input(s): CKMB, TROPONINI, MYOGLOBIN in the last 168 hours.  Invalid input(s): CK ------------------------------------------------------------------------------------------------------------------ No results found for: BNP  Micro Results No results found for this or any previous visit (from the past 240 hour(s)).  Radiology Reports Dg Chest 2  View  Result Date: 03/04/2016 CLINICAL DATA:  Productive cough EXAM: CHEST  2 VIEW COMPARISON:  03/21/2015 FINDINGS: The heart size and mediastinal contours are within normal limits. Both lungs are clear. The visualized skeletal structures are unremarkable. IMPRESSION: No active cardiopulmonary disease. Electronically Signed   By: Alcide CleverMark  Lukens M.D.   On: 03/04/2016 19:59    Time Spent in minutes  30   Anwita Mencer K M.D on 03/05/2016 at 8:47 AM  Between 7am to 7pm - Pager - [phone]  After 7pm go to [email or web address] - password [email or web address]  [facility] -  Office  [phone]

## 2016-03-05 NOTE — H&P (Signed)
History and Physical    ACE BERGFELD ZOX:[id number] DOB: [birth year] DOA: 03/04/2016  PCP: [name], MD  Patient coming from: home  Chief Complaint:  Sob , wheezing  HPI: [name] is a 66 y.o. male with medical history significant of copd not on supplemental oxygen at home comes in with several days of uri symptoms with associated sob, wheezing and cough.  He denies any fevers.  His wheezing has gotten worse, so came to ED where he was hypoxic and diffusely wheezing.  He is improved with nebs, solumedrol and supplemental oxgyen.  Pt was ambulated and his o2 dropped to the low 80s so was therefore referred for admission.  He is feeling better so far.   Review of Systems: As per HPI otherwise 10 point review of systems negative.   Past Medical History:  Diagnosis Date  . Asthma   . COPD (chronic obstructive pulmonary disease) (HCC)   . Depression     Past Surgical History:  Procedure Laterality Date  . APPENDECTOMY    . CHOLECYSTECTOMY    . HAND SURGERY       reports that he has quit smoking. He has quit using smokeless tobacco. He reports that he drinks alcohol. He reports that he does not use drugs.  No Known Allergies  No family history on file. no premature CAD  Prior to Admission medications   Medication Sig Start Date End Date Taking? Authorizing Provider  albuterol (PROVENTIL) (5 MG/ML) 0.5% nebulizer solution Take 0.5 mLs (2.5 mg total) by nebulization every 6 (six) hours as needed for wheezing. 01/27/12  Yes Annamarie Dawley, MD  ANORO ELLIPTA 62.5-25 MCG/INH AEPB Inhale 1 puff into the lungs daily. 02/28/16  Yes Historical Provider, MD  furosemide (LASIX) 20 MG tablet Take 1 tablet by mouth daily. 02/09/16  Yes Historical Provider, MD  Indacaterol-Glycopyrrolate (UTIBRON NEOHALER) 27.5-15.6 MCG CAPS Place 1 puff into inhaler and inhale daily.   Yes Historical Provider, MD  indomethacin (INDOCIN) 50 MG capsule Take 1 capsule by mouth daily. 02/08/16  Yes  Historical Provider, MD  PARoxetine (PAXIL) 20 MG tablet Take 1 tablet by mouth daily. 03/08/15  Yes Historical Provider, MD  PROAIR HFA 108 (90 Base) MCG/ACT inhaler Inhale 2 puffs into the lungs every 4 (four) hours as needed for wheezing or shortness of breath.  03/09/15  Yes Historical Provider, MD  QVAR 80 MCG/ACT inhaler Inhale 1 puff into the lungs 2 (two) times daily. 02/28/16  Yes Historical Provider, MD  phenol (CHLORASEPTIC) 1.4 % LIQD Use as directed 1 spray in the mouth or throat as needed for throat irritation / pain.    Historical Provider, MD    Physical Exam: Vitals:   03/04/16 2138 03/04/16 2303 03/04/16 2308 03/05/16 0037  BP:  117/71    Pulse:  89    Resp:  16    Temp:      TempSrc:      SpO2: 92% 92% 95% 95%  Weight:      Height:        Constitutional: NAD, calm, comfortable Vitals:   03/04/16 2138 03/04/16 2303 03/04/16 2308 03/05/16 0037  BP:  117/71    Pulse:  89    Resp:  16    Temp:      TempSrc:      SpO2: 92% 92% 95% 95%  Weight:      Height:       Eyes: PERRL, lids and conjunctivae normal ENMT: Mucous membranes are  moist. Posterior pharynx clear of any exudate or lesions.Normal dentition.  Neck: normal, supple, no masses, no thyromegaly Respiratory: clear to auscultation bilaterally,  Mild diffuse wheezing, no crackles. Normal respiratory effort. No accessory muscle use.  Cardiovascular: Regular rate and rhythm, no murmurs / rubs / gallops. No extremity edema. 2+ pedal pulses. No carotid bruits.  Abdomen: no tenderness, no masses palpated. No hepatosplenomegaly. Bowel sounds positive.  Musculoskeletal: no clubbing / cyanosis. No joint deformity upper and lower extremities. Good ROM, no contractures. Normal muscle tone.  Skin: no rashes, lesions, ulcers. No induration Neurologic: CN 2-12 grossly intact. Sensation intact, DTR normal. Strength 5/5 in all 4.  Psychiatric: Normal judgment and insight. Alert and oriented x 3. Normal mood.    Labs on  Admission: I have personally reviewed following labs and imaging studies  CBC:  Recent Labs Lab 03/04/16 2053  WBC 6.9  NEUTROABS 4.6  HGB 12.3*  HCT 36.8*  MCV 83.8  PLT 284   Basic Metabolic Panel:  Recent Labs Lab 03/04/16 2053  NA 138  K 4.1  CL 99*  CO2 30  GLUCOSE 112*  BUN 17  CREATININE 1.12  CALCIUM 8.8*   GFR: Estimated Creatinine Clearance: 85 mL/min (by C-G formula based on SCr of 1.12 mg/dL). Liver Function Tests:  Recent Labs Lab 03/04/16 2053  AST 23  ALT 19  ALKPHOS 91  BILITOT 0.7  PROT 6.8  ALBUMIN 3.6   Radiological Exams on Admission: Dg Chest 2 View  Result Date: 03/04/2016 CLINICAL DATA:  Productive cough EXAM: CHEST  2 VIEW COMPARISON:  03/21/2015 FINDINGS: The heart size and mediastinal contours are within normal limits. Both lungs are clear. The visualized skeletal structures are unremarkable. IMPRESSION: No active cardiopulmonary disease. Electronically Signed   By: Alcide CleverMark  Lukens M.D.   On: 03/04/2016 19:59   Assessment/Plan 66 yo male with copde  Principal Problem:   COPD with acute exacerbation (HCC)- freq nebs.  Iv solumedrol.  Zpack. Wean oxygen as tolerates.  Active Problems:   Acute respiratory failure with hypoxia (HCC)- treat copd   COPD (chronic obstructive pulmonary disease) (HCC)   DVT prophylaxis:  Scds, ambulate Code Status:  full Family Communication: none Disposition Plan:  Per day team Consults called:  none Admission status:  observation   Sherleen Pangborn A MD [facility]  If 7PM-7AM, please contact night-coverage [email or web address] Password [email or web address]  03/05/2016, 1:24 AM

## 2016-03-05 NOTE — Progress Notes (Signed)
Pt ambulated approx 300 feet, O2 sat 85-88% room air.

## 2016-03-06 LAB — BASIC METABOLIC PANEL
Anion gap: 9 (ref 5–15)
BUN: 22 mg/dL — AB (ref 6–20)
CALCIUM: 8.9 mg/dL (ref 8.9–10.3)
CHLORIDE: 99 mmol/L — AB (ref 101–111)
CO2: 28 mmol/L (ref 22–32)
CREATININE: 1.26 mg/dL — AB (ref 0.61–1.24)
GFR calc Af Amer: >60 mL/min (ref >60)
GFR, EST NON AFRICAN AMERICAN: 59 mL/min — AB (ref >60)
Glucose, Bld: 140 mg/dL — ABNORMAL HIGH (ref 65–99)
Potassium: 5.2 mmol/L — ABNORMAL HIGH (ref 3.5–5.1)
Sodium: 136 mmol/L (ref 135–145)

## 2016-03-06 MED ORDER — PREDNISONE 5 MG PO TABS: ORAL_TABLET | ORAL | 0 refills | Status: DC

## 2016-03-06 MED ORDER — SODIUM POLYSTYRENE SULFONATE 15 GM/60ML PO SUSP
30.0000 g | Freq: Once | ORAL | Status: AC
  Administered 2016-03-06: 30 g via ORAL
  Filled 2016-03-06: qty 120

## 2016-03-06 MED ORDER — LEVOFLOXACIN 750 MG PO TABS: 750.0000 mg | ORAL_TABLET | Freq: Every day | ORAL | 0 refills | Status: DC

## 2016-03-06 NOTE — Progress Notes (Signed)
SATURATION QUALIFICATIONS: (This note is used to comply with regulatory documentation for home oxygen)  Patient Saturations on Room Air at Rest =92%  Patient Saturations on Room Air while Ambulating = 82%  Patient Saturations on 4 Liters of oxygen while Ambulating = 87%  Patient Saturations on 4 Liters after Ambulating at Rest = 94%

## 2016-03-06 NOTE — Discharge Instructions (Signed)
Follow with Primary MD Evelene CroonNIEMEYER, MEINDERT, MD in 3 days   Get CBC, CMP, 2 view Chest X ray checked  by Primary MD in 3 days ( we routinely change or add medications that can affect your baseline labs and fluid status, therefore we recommend that you get the mentioned basic workup next visit with your PCP, your PCP may decide not to get them or add new tests based on their clinical decision)   Activity: As tolerated with Full fall precautions use walker/cane & assistance as needed   Disposition Home     Diet:  Heart Healthy    For Heart failure patients - Check your Weight same time everyday, if you gain over 2 pounds, or you develop in leg swelling, experience more shortness of breath or chest pain, call your Primary MD immediately. Follow Cardiac Low Salt Diet and 1.5 lit/day fluid restriction.   On your next visit with your primary care physician please Get Medicines reviewed and adjusted.   Please request your Prim.MD to go over all Hospital Tests and Procedure/Radiological results at the follow up, please get all Hospital records sent to your Prim MD by signing hospital release before you go home.   If you experience worsening of your admission symptoms, develop shortness of breath, life threatening emergency, suicidal or homicidal thoughts you must seek medical attention immediately by calling 911 or calling your MD immediately  if symptoms less severe.  You Must read complete instructions/literature along with all the possible adverse reactions/side effects for all the Medicines you take and that have been prescribed to you. Take any new Medicines after you have completely understood and accpet all the possible adverse reactions/side effects.   Do not drive, operate heavy machinery, perform activities at heights, swimming or participation in water activities or provide baby sitting services if your were admitted for syncope or siezures until you have seen by Primary MD or a  Neurologist and advised to do so again.  Do not drive when taking Pain medications.    Do not take more than prescribed Pain, Sleep and Anxiety Medications  Special Instructions: If you have smoked or chewed Tobacco  in the last 2 yrs please stop smoking, stop any regular Alcohol  and or any Recreational drug use.  Wear Seat belts while driving.   Please note  You were cared for by a hospitalist during your hospital stay. If you have any questions about your discharge medications or the care you received while you were in the hospital after you are discharged, you can call the unit and asked to speak with the hospitalist on call if the hospitalist that took care of you is not available. Once you are discharged, your primary care physician will handle any further medical issues. Please note that NO REFILLS for any discharge medications will be authorized once you are discharged, as it is imperative that you return to your primary care physician (or establish a relationship with a primary care physician if you do not have one) for your aftercare needs so that they can reassess your need for medications and monitor your lab values.

## 2016-03-06 NOTE — Progress Notes (Signed)
Discharge instructions and prescriptions given, verbalized understanding, out in stable condition with oxygen.

## 2016-03-06 NOTE — Progress Notes (Signed)
Pt called me to his room and stated he did not feel well throughout the night. He said he got very cold and got up to urinate and felt nauseous. He laid back down and felt better. I asked if he would like something to alleviate his nausea. He said no bc he feels better. He just wanted me to know what went happened. He didn't want to call me sooner bc he didn't think it was a "big deal". He asked me to turn the heater up and to bring him some ice cold water.

## 2016-03-06 NOTE — Discharge Summary (Signed)
[name] ZOX:[id number] DOB: [birth year] DOA: 03/04/2016  PCP: [name], MD  Admit date: 03/04/2016  Discharge date: 03/06/2016  Admitted From: Home  Disposition:  Home   Recommendations for Outpatient Follow-up:   Follow up with PCP in 1-2 weeks  PCP Please obtain BMP/CBC, 2 view CXR in 1week,  (see Discharge instructions)   PCP Please follow up on the following pending results: None   Home Health: None   Equipment/Devices: o2  Consultations: None Discharge Condition: Fair   CODE STATUS: Full   Diet Recommendation:  Heart Healthy    Chief Complaint  Patient presents with  . Shortness of Breath    COPD asthma     Brief history of present illness from the day of admission and additional interim summary     [name] a 65 y.o.malewith medical history significant of copd not on supplemental oxygen at home comes in with several days of uri symptoms with associated sob, wheezing and cough. He denies any fevers. His wheezing has gotten worse, so came to ED where he was hypoxic and diffusely wheezing. He is improved with nebs, solumedrol and supplemental oxgyen. Pt was ambulated and his o2 dropped to the low 80s so was therefore referred for admission. He is feeling better so far.                                                                  Hospital Course    1.COPD Exacerbation. Improved with IV steroids, azithromycin, oxygen and nebulizer treatments. 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 Request PCP to check CBC, BMP and a 2 view chest x-ray in 3 days.  2. ARF. Mild. Likely due to dehydration, gentle hydration. Her function improved, as continue and set upon discharge and follow with PCP in 3 days for a repeat BMP.  3. Mild Hyperkalemia. Due to overreplacement, 1 dose Kayaxalate, good Urine output, follow with PCP in 3 days.   Discharge diagnosis     Principal Problem:   COPD exacerbation (HCC) Active Problems:   COPD (chronic obstructive pulmonary disease) (HCC)   Acute respiratory failure with hypoxia (HCC)    Discharge instructions    Discharge Instructions    Diet - low sodium heart healthy    Complete by:  As directed    Discharge instructions    Complete by:  As directed    Follow with Primary MD [name], MD in 3  days   Get CBC, CMP, 2 view Chest X ray checked  by Primary MD in 3 days ( we routinely change or add medications that can affect your baseline labs and fluid status, therefore we recommend that you get the mentioned basic workup next visit with your PCP, your PCP may decide not to get them or add new tests based on their clinical decision)   Activity: As tolerated with Full fall precautions use walker/cane & assistance as needed   Disposition Home     Diet:  Heart Healthy    For Heart failure patients - Check your Weight same time everyday, if you gain over 2 pounds, or you develop in leg swelling, experience more shortness of breath or chest pain, call your Primary MD immediately. Follow Cardiac Low Salt Diet and 1.5 lit/day fluid restriction.   On your next visit with your primary care physician please Get Medicines reviewed and adjusted.   Please request your Prim.MD to go over all Hospital Tests and Procedure/Radiological results at the follow up, please get all Hospital records sent to your Prim MD by signing hospital release before you go home.   If you experience worsening of your admission symptoms,  develop shortness of breath, life threatening emergency, suicidal or homicidal thoughts you must seek medical attention immediately by calling 911 or calling your MD immediately  if symptoms less severe.  You Must read complete instructions/literature along with all the possible adverse reactions/side effects for all the Medicines you take and that have been prescribed to you. Take any new Medicines after you have completely understood and accpet all the possible adverse reactions/side effects.   Do not drive, operate heavy machinery, perform activities at heights, swimming or participation in water activities or provide baby sitting services if your were admitted for syncope or siezures until you have seen by Primary MD or a Neurologist and advised to do so again.  Do not drive when taking Pain medications.    Do not take more than prescribed Pain, Sleep and Anxiety Medications  Special Instructions: If you have smoked or chewed Tobacco  in the last 2 yrs please stop smoking, stop any regular Alcohol  and or any Recreational drug use.  Wear Seat belts while driving.   Please note  You were cared for by a hospitalist during your hospital stay. If you have any questions about your discharge medications or the care you received while you were in the hospital after you are discharged, you can call the unit and asked to speak with the hospitalist on call if the hospitalist that took care of you is not available. Once you are discharged, your primary care physician will handle any further medical issues. Please note that NO REFILLS for any discharge medications will be authorized once you are discharged, as it is imperative that you return to your primary care physician (or establish a relationship with a primary care physician if you do not have one) for your aftercare needs so that they can reassess your need for medications and monitor your lab values.   Increase activity slowly    Complete by:  As  directed       Discharge Medications   Allergies as of 03/06/2016   No Known Allergies     Medication List    STOP taking these medications   indomethacin 50 MG capsule Commonly known as:  INDOCIN     TAKE these medications   albuterol (5 MG/ML) 0.5% nebulizer solution Commonly  known as:  PROVENTIL Take 0.5 mLs (2.5 mg total) by nebulization every 6 (six) hours as needed for wheezing.   PROAIR HFA 108 (90 Base) MCG/ACT inhaler Generic drug:  albuterol Inhale 2 puffs into the lungs every 4 (four) hours as needed for wheezing or shortness of breath.   ANORO ELLIPTA 62.5-25 MCG/INH Aepb Generic drug:  umeclidinium-vilanterol Inhale 1 puff into the lungs daily.   furosemide 20 MG tablet Commonly known as:  LASIX Take 1 tablet by mouth daily.   levofloxacin 750 MG tablet Commonly known as:  LEVAQUIN Take 1 tablet (750 mg total) by mouth daily.   PARoxetine 20 MG tablet Commonly known as:  PAXIL Take 1 tablet by mouth daily.   phenol 1.4 % Liqd Commonly known as:  CHLORASEPTIC Use as directed 1 spray in the mouth or throat as needed for throat irritation / pain.   predniSONE 5 MG tablet Commonly known as:  DELTASONE Label  & dispense according to the schedule below. 10 Pills PO for 3 days then, 8 Pills PO for 3 days, 6 Pills PO for 3 days, 4 Pills PO for 3 days, 2 Pills PO for 3 days, 1 Pills PO for 3 days, 1/2 Pill  PO for 3 days then STOP. Total 95 pills.   QVAR 80 MCG/ACT inhaler Generic drug:  beclomethasone Inhale 1 puff into the lungs 2 (two) times daily.   UTIBRON NEOHALER 27.5-15.6 MCG Caps Generic drug:  Indacaterol-Glycopyrrolate Place 1 puff into inhaler and inhale daily.            Durable Medical Equipment        Start     Ordered   03/06/16 [phone]  DME Oxygen  Once    Question Answer Comment  Mode or (Route) Nasal cannula   Liters per Minute 2   Frequency Continuous (stationary and portable oxygen unit needed)   Oxygen conserving device  Yes   Oxygen delivery system Gas      03/06/16 0937   03/06/16 0918  For home use only DME oxygen  Once    Question Answer Comment  Mode or (Route) Nasal cannula   Liters per Minute 4   Frequency Continuous (stationary and portable oxygen unit needed)   Oxygen conserving device Yes   Oxygen delivery system Gas      03/06/16 0917      Follow-up Information    [name], MD. Schedule an appointment as soon as possible for a visit in 3 day(s).   Specialty:  Family Medicine Contact information: [facility] [place] [address] [phone]        Fredirick Maudlin, MD. Schedule an appointment as soon as possible for a visit in 1 week(s).   Specialty:  Pulmonary Disease Why:  advanced COPD Contact information: [address] [place] [address] [phone]           Major procedures and Radiology Reports - PLEASE review detailed and final reports thoroughly  -      Dg Chest 2 View  Result Date: 03/04/2016 CLINICAL DATA:  Productive cough EXAM: CHEST  2 VIEW COMPARISON:  03/21/2015 FINDINGS: The heart size and mediastinal contours are within normal limits. Both lungs are clear. The visualized skeletal structures are unremarkable. IMPRESSION: No active cardiopulmonary disease. Electronically Signed   By: Alcide Clever M.D.   On: 03/04/2016 19:59    Micro Results    No results found for this or any previous visit (from the past  240 hour(s)).  Today   Subjective    [name] today has no headache,no chest abdominal pain,no new weakness tingling or numbness, feels much better wants to go home today.   Objective   Blood pressure 128/73, pulse 92, temperature (!) 100.5 F (38.1 C), temperature source Oral, resp. rate 20, height 6\' 2"  (1.88 m), weight 101.8 kg (224 lb 8 oz), SpO2 90 %.   Intake/Output Summary (Last 24 hours) at 03/06/16 0937 Last data filed at 03/06/16 0600  Gross per 24 hour  Intake           1677.5 ml  Output                 0 ml  Net           1677.5 ml    Exam Awake Alert, Oriented x 3, No new F.N deficits, Normal affect [place].AT,PERRAL Supple Neck,No JVD, No cervical lymphadenopathy appriciated.  Symmetrical Chest wall movement, Mod air movement bilaterally, mild wheezing RRR,No Gallops,Rubs or new Murmurs, No Parasternal Heave +ve B.Sounds, Abd Soft, Non tender, No organomegaly appriciated, No rebound -guarding or rigidity. No Cyanosis, Clubbing or edema, No new Rash or bruise   Data Review   CBC w Diff: Lab Results  Component Value Date   WBC 6.6 03/05/2016   HGB 12.3 (L) 03/05/2016   HCT 37.6 (L) 03/05/2016   PLT 319 03/05/2016   LYMPHOPCT 19 03/04/2016   MONOPCT 11 03/04/2016   EOSPCT 2 03/04/2016   BASOPCT 0 03/04/2016    CMP: Lab Results  Component Value Date   NA 136 03/06/2016   K 5.2 (H) 03/06/2016   CL 99 (L) 03/06/2016   CO2 28 03/06/2016   BUN 22 (H) 03/06/2016   CREATININE 1.26 (H) 03/06/2016   PROT 6.8 03/04/2016   ALBUMIN 3.6 03/04/2016   BILITOT 0.7 03/04/2016   ALKPHOS 91 03/04/2016   AST 23 03/04/2016   ALT 19 03/04/2016  .   Total Time in preparing paper work, data evaluation and todays exam - 35 minutes  Leroy SeaSINGH,PRASHANT K M.D on 03/06/2016 at 9:37 AM  [facility]   Office  [phone]

## 2016-03-06 NOTE — Care Management Note (Signed)
Case Management Note  Patient Details  Name: [name] MRN: [id number] Date of Birth: October 27, 1950  Subjective/Objective:                  Admitted with COPD exacerbation. Pt is from home. He is ind with ADL's. He has PCP. He does not have insurance. He is retired and has income. Pt discharging home today and has met requirements for supplemental oxygen. Pt has chosen [facility] from list of DME providers. Romualdo Bolk, of [facility], aware of referral and will obtain pt info from chart and deliver DME to pt room prior to DC. Pt has inhalers and nebs. Pt rx abx and steroids. Needs no assistance with cost of medications.   Action/Plan: DC home with self care and supplemental oxygen through [facility].   Expected Discharge Date:  03/06/16               Expected Discharge Plan:  Home/Self Care  In-House Referral:  NA  Discharge planning Services  CM Consult  Post Acute Care Choice:  Durable Medical Equipment Choice offered to:  Patient  DME Arranged:  Oxygen DME Agency:  [facility].  Status of Service:  Completed, signed off  Sherald Barge, RN 03/06/2016, 12:35 PM

## 2016-06-12 ENCOUNTER — Ambulatory Visit (INDEPENDENT_AMBULATORY_CARE_PROVIDER_SITE_OTHER): Payer: Medicare Other | Admitting: Family Medicine

## 2016-06-12 ENCOUNTER — Encounter: Payer: Self-pay | Admitting: Family Medicine

## 2016-06-12 VITALS — BP 124/76 | HR 72 | Temp 97.7°F | Resp 16 | Ht 74.0 in | Wt 224.1 lb

## 2016-06-12 DIAGNOSIS — Z8739 Personal history of other diseases of the musculoskeletal system and connective tissue: Secondary | ICD-10-CM

## 2016-06-12 DIAGNOSIS — H9190 Unspecified hearing loss, unspecified ear: Secondary | ICD-10-CM | POA: Insufficient documentation

## 2016-06-12 DIAGNOSIS — J4550 Severe persistent asthma, uncomplicated: Secondary | ICD-10-CM | POA: Insufficient documentation

## 2016-06-12 DIAGNOSIS — F341 Dysthymic disorder: Secondary | ICD-10-CM | POA: Diagnosis not present

## 2016-06-12 DIAGNOSIS — H9193 Unspecified hearing loss, bilateral: Secondary | ICD-10-CM | POA: Diagnosis not present

## 2016-06-12 DIAGNOSIS — Z7689 Persons encountering health services in other specified circumstances: Secondary | ICD-10-CM | POA: Diagnosis not present

## 2016-06-12 DIAGNOSIS — F339 Major depressive disorder, recurrent, unspecified: Secondary | ICD-10-CM | POA: Insufficient documentation

## 2016-06-12 MED ORDER — BUDESONIDE-FORMOTEROL FUMARATE 160-4.5 MCG/ACT IN AERO: 2.0000 | INHALATION_SPRAY | Freq: Two times a day (BID) | RESPIRATORY_TRACT | 3 refills | Status: DC

## 2016-06-12 MED ORDER — DULOXETINE HCL 60 MG PO CPEP: 60.0000 mg | ORAL_CAPSULE | Freq: Every day | ORAL | 3 refills | Status: DC

## 2016-06-12 NOTE — Progress Notes (Signed)
Chief Complaint  Patient presents with  . Establish Care   New to establish This office is closer to home He has well controlled asthma  Is on many inhalers Currently albuterol for rescue Symbicort and QVAR daily Anoro and Brevespi (both LABA, anticholinergic) Previously took singulair and spiriva His current complaint is of body pain.  Alternates from hands/feet/shoulders/different places randomly.  He has had several tick bites and feels like he has lyme's disease from his Internet searching.  No rash.  No fever.  NO fatigue.  Joints never look red or inflamed.  His PCP did a lyme test that was NEG and he started him on duloxetine.  Patient Active Problem List   Diagnosis Date Noted  . History of gout 06/12/2016  . Hard of hearing 06/12/2016  . Asthma in adult, severe persistent, uncomplicated 06/12/2016  . Dysthymia 06/12/2016  . COPD (chronic obstructive pulmonary disease) [facility])     Outpatient Encounter Prescriptions as of 06/12/2016  Medication Sig  . albuterol (PROVENTIL) (5 MG/ML) 0.5% nebulizer solution Take 0.5 mLs (2.5 mg total) by nebulization every 6 (six) hours as needed for wheezing.  Ailene Ards ELLIPTA 62.5-25 MCG/INH AEPB Inhale 1 puff into the lungs daily.  . budesonide-formoterol (SYMBICORT) 160-4.5 MCG/ACT inhaler Inhale 2 puffs into the lungs 2 (two) times daily.  . diclofenac (VOLTAREN) 75 MG EC tablet Take 75 mg by mouth 2 (two) times daily.  Marland Kitchen PARoxetine (PAXIL) 20 MG tablet Take 1 tablet by mouth daily.  Marland Kitchen PROAIR HFA 108 (90 Base) MCG/ACT inhaler Inhale 2 puffs into the lungs every 4 (four) hours as needed for wheezing or shortness of breath.   . DULoxetine (CYMBALTA) 60 MG capsule Take 1 capsule (60 mg total) by mouth daily.   No facility-administered encounter medications on file as of 06/12/2016.     Past Medical History:  Diagnosis Date  . Anxiety   . Arthritis    arthritis  . Asthma   . COPD (chronic obstructive pulmonary disease) (HCC)   .  Depression     Past Surgical History:  Procedure Laterality Date  . APPENDECTOMY    . CHOLECYSTECTOMY    . HAND SURGERY      Social History   Social History  . Marital status: Widowed    Spouse name: Surveyor, minerals  . Number of children: 2  . Years of education: 16   Occupational History  . retired     [employer]   Social History Main Topics  . Smoking status: Former Games developer  . Smokeless tobacco: Former Neurosurgeon  . Alcohol use Yes     Comment: occasionally  . Drug use: No  . Sexual activity: Not Currently   Other Topics Concern  . 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 Diabetes Mother   . Heart disease Mother   . Heart disease Father     old age at 50  . Heart disease Brother 58    cabg  . Early death Son 25    MVA    Review of Systems  Constitutional: Negative for chills, fever and weight loss.  HENT: Positive for hearing loss. Negative for congestion.  Eyes: Negative for blurred vision and pain.  Respiratory: Negative for cough, shortness of breath and wheezing.        None currently  Cardiovascular: Negative for chest pain and leg swelling.  Gastrointestinal: Negative for constipation, diarrhea and heartburn.  Genitourinary: Negative for dysuria and frequency.  Musculoskeletal: Negative for falls, joint pain and myalgias.       Arthralgias  Neurological: Negative for dizziness, seizures and headaches.  Psychiatric/Behavioral: Positive for depression. The patient is not nervous/anxious and does not have insomnia.        At times - managed   BP 124/76 (BP Location: Right Arm, Patient Position: Sitting, Cuff Size: Normal)   Pulse 72   Temp 97.7 F (36.5 C) (Temporal)   Resp 16   Ht 6\' 2"  (1.88 m)   Wt 224 lb 1.9 oz (101.7 kg)    SpO2 97%   BMI 28.78 kg/m   Physical Exam  Constitutional: He is oriented to person, place, and time. He appears well-developed and well-nourished.  HENT:  Head: Normocephalic and atraumatic.  Right Ear: External ear normal.  Left Ear: External ear normal.  Mouth/Throat: Oropharynx is clear and moist.  Teeth well restored  Eyes: Conjunctivae are normal. Pupils are equal, round, and reactive to light.  Neck: Normal range of motion. Neck supple. No thyromegaly present.  Cardiovascular: Normal rate, regular rhythm and normal heart sounds.   Pulmonary/Chest: Effort normal and breath sounds normal. No respiratory distress. He has no wheezes.  Abdominal: Soft. Bowel sounds are normal.  Musculoskeletal: Normal range of motion. He exhibits no edema.  No synovitis of hand joints or deformities  Lymphadenopathy:    He has no cervical adenopathy.  Neurological: He is alert and oriented to person, place, and time.  Gait normal  Skin: Skin is warm and dry.  Psychiatric: He has a normal mood and affect. His behavior is normal. Thought content normal.  Nursing note and vitals reviewed. ASSESSMENT/PLAN:   1. History of gout voltaren prn  2. Bilateral hearing loss, unspecified hearing loss type Hearing aids at home  3. Asthma in adult, severe persistent, uncomplicated controlled  4. Encounter to establish care with new doctor Old records requested  5. Dysthymia On paxil many years.     Patient Instructions  Need old records from PCP  Stop the Qvar Stop the Bevespi  ( OR Anoro)  See me in one month    Eustace MooreYvonne Sue Judaea Burgoon, MD

## 2016-06-12 NOTE — Patient Instructions (Addendum)
Need old records from PCP  Stop the Qvar Stop the Bevespi  ( OR Anoro)  See me in one month

## 2016-07-05 ENCOUNTER — Ambulatory Visit (INDEPENDENT_AMBULATORY_CARE_PROVIDER_SITE_OTHER): Payer: Medicare Other | Admitting: Family Medicine

## 2016-07-05 ENCOUNTER — Encounter: Payer: Self-pay | Admitting: Family Medicine

## 2016-07-05 VITALS — BP 132/84 | HR 68 | Temp 98.1°F | Resp 16 | Ht 74.0 in | Wt 232.0 lb

## 2016-07-05 DIAGNOSIS — J449 Chronic obstructive pulmonary disease, unspecified: Secondary | ICD-10-CM | POA: Diagnosis not present

## 2016-07-05 DIAGNOSIS — Z8739 Personal history of other diseases of the musculoskeletal system and connective tissue: Secondary | ICD-10-CM | POA: Diagnosis not present

## 2016-07-05 DIAGNOSIS — M791 Myalgia: Secondary | ICD-10-CM | POA: Diagnosis not present

## 2016-07-05 DIAGNOSIS — F341 Dysthymic disorder: Secondary | ICD-10-CM | POA: Diagnosis not present

## 2016-07-05 DIAGNOSIS — M2550 Pain in unspecified joint: Secondary | ICD-10-CM | POA: Diagnosis not present

## 2016-07-05 DIAGNOSIS — M7918 Myalgia, other site: Secondary | ICD-10-CM

## 2016-07-05 DIAGNOSIS — Z1211 Encounter for screening for malignant neoplasm of colon: Secondary | ICD-10-CM

## 2016-07-05 LAB — CBC WITH DIFFERENTIAL/PLATELET
BASOS PCT: 1 %
Basophils Absolute: 79 cells/uL (ref 0–200)
EOS ABS: 395 {cells}/uL (ref 15–500)
Eosinophils Relative: 5 %
HEMATOCRIT: 41.1 % (ref 38.5–50.0)
HEMOGLOBIN: 13.4 g/dL (ref 13.2–17.1)
LYMPHS ABS: 1580 {cells}/uL (ref 850–3900)
LYMPHS PCT: 20 %
MCH: 27.2 pg (ref 27.0–33.0)
MCHC: 32.6 g/dL (ref 32.0–36.0)
MCV: 83.5 fL (ref 80.0–100.0)
MONO ABS: 395 {cells}/uL (ref 200–950)
MPV: 8.8 fL (ref 7.5–12.5)
Monocytes Relative: 5 %
NEUTROS ABS: 5451 {cells}/uL (ref 1500–7800)
Neutrophils Relative %: 69 %
Platelets: 323 10*3/uL (ref 140–400)
RBC: 4.92 MIL/uL (ref 4.20–5.80)
RDW: 15 % (ref 11.0–15.0)
WBC: 7.9 10*3/uL (ref 3.8–10.8)

## 2016-07-05 LAB — VITAMIN B12: Vitamin B-12: 513 pg/mL (ref 200–1100)

## 2016-07-05 LAB — TSH: TSH: 2.55 m[IU]/L (ref 0.40–4.50)

## 2016-07-05 LAB — SEDIMENTATION RATE: Sed Rate: 11 mm/hr (ref 0–20)

## 2016-07-05 NOTE — Patient Instructions (Signed)
Need blood work today I will send you a letter with your test results.  If there is anything of concern, we will call right away.  Continue the duloxetine Taper and stop the paxil  Still need old records - ask again  See me in 3 months

## 2016-07-05 NOTE — Progress Notes (Signed)
Chief Complaint  Patient presents with  . Follow-up    1 month   Here for follow up I still do not have records from prior MD He expressed frustration at continued body pain.  Migratory.  sometimes shoulders, thumbs, legs, upper back muscles.  No redness or swelling of joints., unless gout flare of toes.  He feels more stiff in the morning.  Feels somewhat better on the anti inflammatory - when taken.   No prior history of arthritis.  Exercise tolerance is poor.  No shortness of breath or chest pain.  No GI distress.  No rash.  No numbness or weakness. COPD is stable on the current inhalers Has cut paxil in half and feels no different.  Will discontinue.   Patient Active Problem List   Diagnosis Date Noted  . History of gout 06/12/2016  . Hard of hearing 06/12/2016  . Asthma in adult, severe persistent, uncomplicated 04/27/2246  . Dysthymia 06/12/2016  . COPD (chronic obstructive pulmonary disease) [facility])     Outpatient Encounter Prescriptions as of 07/05/2016  Medication Sig  . albuterol (PROVENTIL) (5 MG/ML) 0.5% nebulizer solution Take 0.5 mLs (2.5 mg total) by nebulization every 6 (six) hours as needed for wheezing.  Jearl Klinefelter ELLIPTA 62.5-25 MCG/INH AEPB Inhale 1 puff into the lungs daily.  . budesonide-formoterol (SYMBICORT) 160-4.5 MCG/ACT inhaler Inhale 2 puffs into the lungs 2 (two) times daily.  . diclofenac (VOLTAREN) 75 MG EC tablet Take 75 mg by mouth 2 (two) times daily.  . DULoxetine (CYMBALTA) 60 MG capsule Take 1 capsule (60 mg total) by mouth daily.  Marland Kitchen PARoxetine (PAXIL) 20 MG tablet Take 1 tablet by mouth daily.  Marland Kitchen PROAIR HFA 108 (90 Base) MCG/ACT inhaler Inhale 2 puffs into the lungs every 4 (four) hours as needed for wheezing or shortness of breath.    No facility-administered encounter medications on file as of 07/05/2016.     No Known Allergies  Review of Systems  Constitutional: Negative for activity change, appetite change, chills, fatigue, fever and  unexpected weight change.  HENT: Positive for hearing loss. Negative for congestion and dental problem.   Eyes: Negative for photophobia and visual disturbance.  Respiratory: Negative for cough and shortness of breath.   Cardiovascular: Negative for chest pain, palpitations and leg swelling.  Gastrointestinal: Negative for constipation and diarrhea.  Endocrine: Negative for cold intolerance and heat intolerance.  Genitourinary: Negative for difficulty urinating and frequency.  Musculoskeletal: Positive for arthralgias and myalgias.  Skin: Negative.  Negative for rash.  Neurological: Negative for dizziness and headaches.  Psychiatric/Behavioral: Negative for decreased concentration and sleep disturbance. The patient is not nervous/anxious.     BP 132/84 (BP Location: Right Arm, Patient Position: Sitting, Cuff Size: Large)   Pulse 68   Temp 98.1 F (36.7 C) (Temporal)   Resp 16   Ht '6\' 2"'  (1.88 m)   Wt 232 lb 0.6 oz (105.3 kg)   SpO2 95%   BMI 29.79 kg/m   Physical Exam  Constitutional: He is oriented to person, place, and time. He appears well-developed and well-nourished.  HENT:  Head: Normocephalic and atraumatic.  Mouth/Throat: Oropharynx is clear and moist.  Eyes: Conjunctivae are normal. Pupils are equal, round, and reactive to light.  Neck: Normal range of motion. Neck supple. No thyromegaly present.  Cardiovascular: Normal rate, regular rhythm and normal heart sounds.   Pulmonary/Chest: Effort normal and breath sounds normal. No respiratory distress. He has no wheezes.  Musculoskeletal: Normal range of  motion. He exhibits no edema.  Symmetric.  Mild tenderness CMC of thumbs  Lymphadenopathy:    He has no cervical adenopathy.  Neurological: He is alert and oriented to person, place, and time.  Gait normal  Skin: Skin is warm and dry.  Psychiatric: His behavior is normal. Thought content normal.  Nursing note and vitals reviewed.   ASSESSMENT/PLAN:  1.  Polyarthralgia - Sed Rate (ESR) - C-reactive protein - Rheumatoid Factor - Antinuclear Antib (ANA) - VITAMIN D 25 Hydroxy (Vit-D Deficiency, Fractures) - TSH - Hepatitis C antibody - Vitamin B12 - Lyme Disease Abs IgG, IgM, IFA, CSF - CBC with Differential/Platelet  2. Muscle pain, myofascial  3. Screen for colon cancer - Ambulatory referral to Gastroenterology  4. History of gout  5. Dysthymia  6. COPD  Patient Instructions  Need blood work today I will send you a letter with your test results.  If there is anything of concern, we will call right away.  Continue the duloxetine Taper and stop the paxil  Still need old records - ask again  See me in 3 months     Raylene Everts, MD

## 2016-07-06 LAB — RHEUMATOID FACTOR

## 2016-07-06 LAB — ANA: Anti Nuclear Antibody(ANA): NEGATIVE

## 2016-07-06 LAB — HEPATITIS C ANTIBODY: HCV AB: NEGATIVE

## 2016-07-06 LAB — VITAMIN D 25 HYDROXY (VIT D DEFICIENCY, FRACTURES): VIT D 25 HYDROXY: 25 ng/mL — AB (ref 30–100)

## 2016-07-06 LAB — LYME AB/WESTERN BLOT REFLEX: B burgdorferi Ab IgG+IgM: <0.9 Index (ref <0.90)

## 2016-07-06 LAB — C-REACTIVE PROTEIN: CRP: 19.4 mg/L — ABNORMAL HIGH (ref <8.0)

## 2016-07-07 ENCOUNTER — Other Ambulatory Visit: Payer: Self-pay | Admitting: Family Medicine

## 2016-07-07 DIAGNOSIS — M130 Polyarthritis, unspecified: Secondary | ICD-10-CM

## 2016-07-07 LAB — LYME DISEASE ABS IGG, IGM, IFA, CSF

## 2016-07-10 ENCOUNTER — Telehealth: Payer: Self-pay | Admitting: Family Medicine

## 2016-07-10 NOTE — Telephone Encounter (Signed)
Patient is requesting a call to go over a series of blood work he did last week. [phone]

## 2016-07-10 NOTE — Telephone Encounter (Signed)
Federated Department StoresCalled Eben, said he had bad reception, I will attempt to call later.

## 2016-07-11 NOTE — Telephone Encounter (Signed)
Called Jaymin again today, left message to call office.

## 2016-07-13 ENCOUNTER — Ambulatory Visit: Payer: Medicare Other | Admitting: Family Medicine

## 2016-07-18 ENCOUNTER — Telehealth: Payer: Self-pay | Admitting: Family Medicine

## 2016-07-18 NOTE — Telephone Encounter (Signed)
Patient left afternoon voice mail message on 07-17-16 stating he just wanted to talk to Dr. Delton SeeNelson (no description).  I returned call but got his voice mail.  I left message asking him to return call so that I could give message to Dr. Delton SeeNelson when she returns to the office.

## 2016-07-20 ENCOUNTER — Encounter: Payer: Self-pay | Admitting: Family Medicine

## 2016-07-21 ENCOUNTER — Encounter: Payer: Self-pay | Admitting: Family Medicine

## 2016-07-31 ENCOUNTER — Encounter: Payer: Self-pay | Admitting: Family Medicine

## 2016-07-31 DIAGNOSIS — E049 Nontoxic goiter, unspecified: Secondary | ICD-10-CM

## 2016-07-31 HISTORY — DX: Nontoxic goiter, unspecified: E04.9

## 2016-08-22 ENCOUNTER — Encounter: Payer: Self-pay | Admitting: Family Medicine

## 2016-08-23 ENCOUNTER — Other Ambulatory Visit: Payer: Self-pay | Admitting: Family Medicine

## 2016-08-24 ENCOUNTER — Other Ambulatory Visit: Payer: Self-pay | Admitting: Family Medicine

## 2016-08-24 MED ORDER — MEBENDAZOLE 100 MG PO CHEW: 100.0000 mg | CHEWABLE_TABLET | Freq: Two times a day (BID) | ORAL | 0 refills | Status: DC

## 2016-08-25 ENCOUNTER — Encounter: Payer: Self-pay | Admitting: Family Medicine

## 2016-08-25 ENCOUNTER — Other Ambulatory Visit: Payer: Self-pay | Admitting: Family Medicine

## 2016-08-25 MED ORDER — PRAZIQUANTEL 600 MG PO TABS: 600.0000 mg | ORAL_TABLET | Freq: Once | ORAL | 0 refills | Status: DC

## 2016-09-11 ENCOUNTER — Other Ambulatory Visit: Payer: Self-pay | Admitting: Family Medicine

## 2016-09-18 ENCOUNTER — Encounter: Payer: Self-pay | Admitting: Family Medicine

## 2016-10-06 ENCOUNTER — Telehealth: Payer: Self-pay

## 2016-10-06 NOTE — Telephone Encounter (Signed)
Attempted to call [name], voice mail full.

## 2016-10-09 ENCOUNTER — Emergency Department (HOSPITAL_COMMUNITY)
Admission: EM | Admit: 2016-10-09 | Discharge: 2016-10-09 | Disposition: A | Discharge status: Home / Self Care | Payer: Medicare Other | Attending: Emergency Medicine | Admitting: Emergency Medicine

## 2016-10-09 ENCOUNTER — Encounter (HOSPITAL_COMMUNITY): Payer: Self-pay | Admitting: Emergency Medicine

## 2016-10-09 DIAGNOSIS — Z79899 Other long term (current) drug therapy: Secondary | ICD-10-CM | POA: Diagnosis not present

## 2016-10-09 DIAGNOSIS — J449 Chronic obstructive pulmonary disease, unspecified: Secondary | ICD-10-CM | POA: Diagnosis not present

## 2016-10-09 DIAGNOSIS — Z87891 Personal history of nicotine dependence: Secondary | ICD-10-CM | POA: Insufficient documentation

## 2016-10-09 DIAGNOSIS — M2550 Pain in unspecified joint: Secondary | ICD-10-CM | POA: Insufficient documentation

## 2016-10-09 DIAGNOSIS — J45909 Unspecified asthma, uncomplicated: Secondary | ICD-10-CM | POA: Diagnosis not present

## 2016-10-09 DIAGNOSIS — R3911 Hesitancy of micturition: Secondary | ICD-10-CM

## 2016-10-09 DIAGNOSIS — R52 Pain, unspecified: Secondary | ICD-10-CM | POA: Diagnosis present

## 2016-10-09 LAB — URINALYSIS, ROUTINE W REFLEX MICROSCOPIC
BACTERIA UA: NONE SEEN
BILIRUBIN URINE: NEGATIVE
Glucose, UA: NEGATIVE mg/dL
Hgb urine dipstick: NEGATIVE
Ketones, ur: NEGATIVE mg/dL
NITRITE: NEGATIVE
PROTEIN: 30 mg/dL — AB
SPECIFIC GRAVITY, URINE: 1.029 (ref 1.005–1.030)
pH: 5 (ref 5.0–8.0)

## 2016-10-09 MED ORDER — TAMSULOSIN HCL 0.4 MG PO CAPS: 0.4000 mg | ORAL_CAPSULE | Freq: Every day | ORAL | 0 refills | Status: DC

## 2016-10-09 MED ORDER — METHYLPREDNISOLONE SODIUM SUCC 125 MG IJ SOLR
125.0000 mg | Freq: Once | INTRAMUSCULAR | Status: AC
  Administered 2016-10-09: 125 mg via INTRAMUSCULAR
  Filled 2016-10-09: qty 2

## 2016-10-09 MED ORDER — PREDNISONE 20 MG PO TABS: ORAL_TABLET | ORAL | 0 refills | Status: DC

## 2016-10-09 NOTE — ED Triage Notes (Signed)
Pt c/o generalized pain/joint pain x 9 months-worsening over the past 2 days. Pt reports he has been seen by pcp and is negative for lyme disease. Pt also reports difficulty urinating-frequent small amounts. nad noted.

## 2016-10-09 NOTE — ED Notes (Signed)
Patient complaining of pain to joints (bilateral shoulders, wrist, and ankles). States he had same problem 1 year ago and has been following up with PCP. States pain has become worse 3 days ago.

## 2016-10-09 NOTE — ED Provider Notes (Signed)
[facility]EMER[facility]Y DEPT Provider Note   CSN: [id number] Arrival date & time: 10/09/16  1034     History   Chief Complaint Chief Complaint  Patient presents with  . Joint Pain    HPI [name] is a 66 y.o. male.  HPI Patient presents with diffuse bilateral joint pain for the past 9 months. Pain is mostly in his shoulders, hands, ankles and feet. No new swelling, redness or warmth. No fever or chills. States that symptoms improved when he was being treated for COPD exacerbation. He is yet to see a rheumatologist. Pain was worse over the last few days. Patient also states he's had some urinary hesitancy but denies dysuria, hematuria. Past Medical History:  Diagnosis Date  . Anxiety   . Arthritis    arthritis  . Asthma   . COPD (chronic obstructive pulmonary disease) (HCC)   . Depression   . 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 History of gout 06/12/2016  . Hard of hearing 06/12/2016  . Asthma in adult, severe persistent, uncomplicated 06/12/2016  . Dysthymia 06/12/2016  . COPD (chronic obstructive pulmonary disease) (HCC)     Past Surgical History:  Procedure Laterality Date  . APPENDECTOMY    . CHOLECYSTECTOMY    . HAND SURGERY         Home Medications    Prior to Admission medications   Medication Sig Start Date End Date Taking? Authorizing Provider  ANORO ELLIPTA 62.5-25 MCG/INH AEPB Inhale 1 puff into the lungs daily. 02/28/16  Yes [provider]  budesonide-formoterol (SYMBICORT) 160-4.5 MCG/ACT inhaler Inhale 2 puffs into the lungs 2 (two) times daily. 06/12/16  Yes Eustace Moore, MD  DULoxetine (CYMBALTA) 60 MG capsule Take 1 capsule (60 mg total) by mouth daily. 06/12/16  Yes Eustace Moore, MD  PARoxetine (PAXIL) 20 MG tablet Take 1 tablet by mouth daily. 03/08/15  Yes [provider]  PROAIR HFA 108 (90 Base) MCG/ACT inhaler Inhale 2 puffs into the  lungs every 4 (four) hours as needed for wheezing or shortness of breath.  03/09/15  Yes [provider]  mebendazole (VERMOX) 100 MG chewable tablet Chew 1 tablet (100 mg total) by mouth 2 (two) times daily. 08/24/16   Eustace Moore, MD  praziquantel (BILTRICIDE) 600 MG tablet TAKE 1 TABLET(600 MG) BY MOUTH 1 TIME 09/11/16   Kerri Perches, MD  predniSONE (DELTASONE) 20 MG tablet 3 tabs po daily x 3 days, then 2 tabs x 3 days, then 1.5 tabs x 3 days, then 1 tab x 3 days, then 0.5 tabs x 3 days 10/10/16   Loren Racer, MD  tamsulosin (FLOMAX) 0.4 MG CAPS capsule Take 1 capsule (0.4 mg total) by mouth daily. 10/09/16   Loren Racer, MD    Family History Family History  Problem Relation Age of Onset  . Diabetes Mother   . Heart disease Mother   . Heart disease Father        old age at 30  . Heart disease Brother 31       cabg  . Early death Son 24       MVA    Social History Social History  Substance Use Topics  . Smoking status: Former Games developer  . Smokeless tobacco: Former Neurosurgeon  . Alcohol use Yes     Comment: occasionally     Allergies   Patient has no known  allergies.   Review of Systems Review of Systems  Constitutional: Negative for chills and fever.  HENT: Negative for facial swelling and trouble swallowing.   Eyes: Negative for visual disturbance.  Respiratory: Negative for cough and shortness of breath.   Cardiovascular: Negative for chest pain, palpitations and leg swelling.  Gastrointestinal: Negative for abdominal pain, diarrhea, nausea and vomiting.  Genitourinary: Positive for difficulty urinating and frequency. Negative for dysuria, flank pain and hematuria.  Musculoskeletal: Positive for arthralgias. Negative for back pain, joint swelling, myalgias, neck pain and neck stiffness.  Skin: Negative for rash and wound.  Neurological: Negative for dizziness, syncope, weakness, light-headedness, numbness and headaches.  All other systems reviewed  and are negative.    Physical Exam Updated Vital Signs BP 127/76 (BP Location: Right Arm)   Pulse 61   Temp 97.8 F (36.6 C)   Resp 18   Ht 6\' 2"  (1.88 m)   Wt 99.8 kg (220 lb)   SpO2 99%   BMI 28.25 kg/m   Physical Exam  Constitutional: He is oriented to person, place, and time. He appears well-developed and well-nourished. No distress.  HENT:  Head: Normocephalic and atraumatic.  Mouth/Throat: Oropharynx is clear and moist. No oropharyngeal exudate.  Eyes: Pupils are equal, round, and reactive to light. EOM are normal.  Neck: Normal range of motion. Neck supple.  Cardiovascular: Normal rate and regular rhythm.  Exam reveals no gallop and no friction rub.   No murmur heard. Pulmonary/Chest: Effort normal and breath sounds normal. No respiratory distress. He has no wheezes. He has no rales. He exhibits no tenderness.  Abdominal: Soft. Bowel sounds are normal. There is no tenderness. There is no rebound and no guarding.  Musculoskeletal: Normal range of motion. He exhibits no edema or tenderness.  No obvious deformity, swelling, warmth or redness of shoulders, elbows, wrists, hands, hips, knees, ankles or feet. No midline thoracic or lumbar tenderness. No CVA tenderness. Distal pulses are 2+. Good distal cap refill  Neurological: He is alert and oriented to person, place, and time.  5/5 motor in all extremities. Sensation fully intact.  Skin: Skin is warm and dry. Capillary refill takes less than 2 seconds. No rash noted. He is not diaphoretic. No erythema. No pallor.  Psychiatric: He has a normal mood and affect. His behavior is normal.  Nursing note and vitals reviewed.    ED Treatments / Results  Labs (all labs ordered are listed, but only abnormal results are displayed) Labs Reviewed  URINALYSIS, ROUTINE W REFLEX MICROSCOPIC - Abnormal; Notable for the following:       Result Value   Color, Urine AMBER (*)    Protein, ur 30 (*)    Leukocytes, UA TRACE (*)    Squamous  Epithelial / LPF 0-5 (*)    All other components within normal limits    EKG  EKG Interpretation None       Radiology No results found.  Procedures Procedures (including critical care time)  Medications Ordered in ED Medications  methylPREDNISolone sodium succinate (SOLU-MEDROL) 125 mg/2 mL injection 125 mg (125 mg Intramuscular Given 10/09/16 1243)     Initial Impression / Assessment and Plan / ED Course  I have reviewed the triage vital signs and the nursing notes.  Pertinent labs & imaging results that were available during my care of the patient were reviewed by me and considered in my medical decision making (see chart for details).     Patient with diffuse arthralgia for the past  9 months. Question some type of autoimmune disorder. States his primary doctor is arranging referral to rheumatologist. He did note that he had improved symptoms when being treated for COPD exacerbation. Likely due to steroids. Discussed about 2 week course of tapering steroids. Patient like to try this and see if this improves is symptoms. We'll also check urine for evidence of urinary tract infection.  Final Clinical Impressions(s) / ED Diagnoses   Final diagnoses:  Arthralgia, unspecified joint  Urinary hesitancy    New Prescriptions Discharge Medication List as of 10/09/2016  1:45 PM    START taking these medications   Details  predniSONE (DELTASONE) 20 MG tablet 3 tabs po daily x 3 days, then 2 tabs x 3 days, then 1.5 tabs x 3 days, then 1 tab x 3 days, then 0.5 tabs x 3 days, Print    tamsulosin (FLOMAX) 0.4 MG CAPS capsule Take 1 capsule (0.4 mg total) by mouth daily., Starting Mon 10/09/2016, Print         Loren RacerYelverton, Nesta Kimple, MD 10/09/16 [phone]

## 2016-10-10 ENCOUNTER — Encounter: Payer: Self-pay | Admitting: Family Medicine

## 2016-10-16 ENCOUNTER — Encounter: Payer: Self-pay | Admitting: Family Medicine

## 2016-10-17 ENCOUNTER — Encounter: Payer: Self-pay | Admitting: Family Medicine

## 2016-10-30 ENCOUNTER — Encounter: Payer: Self-pay | Admitting: Family Medicine

## 2016-10-31 ENCOUNTER — Ambulatory Visit (INDEPENDENT_AMBULATORY_CARE_PROVIDER_SITE_OTHER): Payer: Medicare Other

## 2016-10-31 ENCOUNTER — Other Ambulatory Visit: Payer: Self-pay | Admitting: Family Medicine

## 2016-10-31 DIAGNOSIS — Z23 Encounter for immunization: Secondary | ICD-10-CM | POA: Diagnosis not present

## 2016-10-31 DIAGNOSIS — M130 Polyarthritis, unspecified: Secondary | ICD-10-CM

## 2016-10-31 MED ORDER — PREDNISONE 10 MG PO TABS: ORAL_TABLET | ORAL | 0 refills | Status: DC

## 2016-11-07 ENCOUNTER — Encounter: Payer: Self-pay | Admitting: General Surgery

## 2016-11-07 ENCOUNTER — Ambulatory Visit (INDEPENDENT_AMBULATORY_CARE_PROVIDER_SITE_OTHER): Payer: Medicare Other | Admitting: General Surgery

## 2016-11-07 VITALS — BP 122/67 | HR 71 | Temp 98.7°F | Resp 18 | Ht 74.0 in | Wt 221.0 lb

## 2016-11-07 DIAGNOSIS — Z1211 Encounter for screening for malignant neoplasm of colon: Secondary | ICD-10-CM | POA: Diagnosis not present

## 2016-11-07 MED ORDER — PEG 3350-KCL-NABCB-NACL-NASULF 236 G PO SOLR: 4000.0000 mL | Freq: Once | ORAL | 0 refills | Status: AC

## 2016-11-07 NOTE — H&P (Signed)
[name]; [id number]; 1950/05/22   HPI Patient is a 66 year old white male who was referred to my care by Dr. Delton See for a screening colonoscopy. He last had a colonoscopy in 2008. He denies any family history of colon cancer, abdominal pain, abnormal blood per rectum, constipation, or diarrhea. He denies any weight loss. He currently has no pain. Past Medical History:  Diagnosis Date  . Anxiety   . Arthritis    arthritis  . Asthma   . COPD (chronic obstructive pulmonary disease) (HCC)   . Depression   . 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 APPENDECTOMY    . CHOLECYSTECTOMY    . HAND SURGERY      Family History  Problem Relation Age of Onset  . Diabetes Mother   . Heart disease Mother   . Heart disease Father        old age at 48  . Heart disease Brother 59       cabg  . Early death Son 67       MVA    Current Outpatient Prescriptions on File Prior to Visit  Medication Sig Dispense Refill  . ANORO ELLIPTA 62.5-25 MCG/INH AEPB Inhale 1 puff into the lungs daily.  5  . budesonide-formoterol (SYMBICORT) 160-4.5 MCG/ACT inhaler Inhale 2 puffs into the lungs 2 (two) times daily. 3 Inhaler 3  . DULoxetine (CYMBALTA) 60 MG capsule Take 1 capsule (60 mg total) by mouth daily. 30 capsule 3  . mebendazole (VERMOX) 100 MG chewable tablet Chew 1 tablet (100 mg total) by mouth 2 (two) times daily. 10 tablet 0  . PARoxetine (PAXIL) 20 MG tablet Take 1 tablet by mouth daily.  0  . praziquantel (BILTRICIDE) 600 MG tablet TAKE 1 TABLET(600 MG) BY MOUTH 1 TIME 1 tablet 0  . predniSONE (DELTASONE) 10 MG tablet 2 a day for 3 days then one a day 30 tablet 0  . PROAIR HFA 108 (90 Base) MCG/ACT inhaler Inhale 2 puffs into the lungs every 4 (four) hours as needed for wheezing or shortness of breath.   2  . tamsulosin (FLOMAX) 0.4 MG CAPS capsule Take 1 capsule (0.4 mg total) by mouth daily. 30 capsule 0   No current facility-administered  medications on file prior to visit.     No Known Allergies  History  Alcohol Use  . Yes    Comment: occasionally    History  Smoking Status  . Former Smoker  Smokeless Tobacco  . Former Neurosurgeon    Review of Systems  Constitutional: Negative.   HENT: Negative.   Eyes: Positive for blurred vision.  Respiratory: Negative.   Cardiovascular: Negative.   Gastrointestinal: Negative.   Genitourinary: Negative.   Musculoskeletal: Positive for joint pain.  Skin: Negative.   Neurological: Negative.   Endo/Heme/Allergies: Negative.   Psychiatric/Behavioral: Negative.     Objective   Vitals:   11/07/16 1438  BP: 122/67  Pulse: 71  Resp: 18  Temp: 98.7 F (37.1 C)    Physical Exam  Constitutional: He is oriented to person, place, and time and well-developed, well-nourished, and in no distress.  HENT:  Head: Normocephalic and atraumatic.  Cardiovascular: Normal rate, regular rhythm and normal heart sounds.  Exam reveals no gallop and no friction rub.   No murmur heard. Pulmonary/Chest: Effort normal and breath sounds normal. No respiratory distress. He has no wheezes. He has no rales.  Abdominal: Soft. Bowel sounds are normal. He  exhibits no distension. There is no tenderness. There is no rebound.  Neurological: He is alert and oriented to person, place, and time.  Skin: Skin is warm and dry.  Vitals reviewed.   Assessment  Need for screening colonoscopy Plan   Patient scheduled for a screening colonoscopy on 11/21/2016. The risks and benefits of the procedure including bleeding and perforation were fully explained to the patient, who gave informed consent. 

## 2016-11-07 NOTE — Progress Notes (Signed)
[name]; [id number]; 1950/05/22   HPI Patient is a 66 year old white male who was referred to my care by Dr. Delton See for a screening colonoscopy. He last had a colonoscopy in 2008. He denies any family history of colon cancer, abdominal pain, abnormal blood per rectum, constipation, or diarrhea. He denies any weight loss. He currently has no pain. Past Medical History:  Diagnosis Date  . Anxiety   . Arthritis    arthritis  . Asthma   . COPD (chronic obstructive pulmonary disease) (HCC)   . Depression   . 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 APPENDECTOMY    . CHOLECYSTECTOMY    . HAND SURGERY      Family History  Problem Relation Age of Onset  . Diabetes Mother   . Heart disease Mother   . Heart disease Father        old age at 48  . Heart disease Brother 59       cabg  . Early death Son 67       MVA    Current Outpatient Prescriptions on File Prior to Visit  Medication Sig Dispense Refill  . ANORO ELLIPTA 62.5-25 MCG/INH AEPB Inhale 1 puff into the lungs daily.  5  . budesonide-formoterol (SYMBICORT) 160-4.5 MCG/ACT inhaler Inhale 2 puffs into the lungs 2 (two) times daily. 3 Inhaler 3  . DULoxetine (CYMBALTA) 60 MG capsule Take 1 capsule (60 mg total) by mouth daily. 30 capsule 3  . mebendazole (VERMOX) 100 MG chewable tablet Chew 1 tablet (100 mg total) by mouth 2 (two) times daily. 10 tablet 0  . PARoxetine (PAXIL) 20 MG tablet Take 1 tablet by mouth daily.  0  . praziquantel (BILTRICIDE) 600 MG tablet TAKE 1 TABLET(600 MG) BY MOUTH 1 TIME 1 tablet 0  . predniSONE (DELTASONE) 10 MG tablet 2 a day for 3 days then one a day 30 tablet 0  . PROAIR HFA 108 (90 Base) MCG/ACT inhaler Inhale 2 puffs into the lungs every 4 (four) hours as needed for wheezing or shortness of breath.   2  . tamsulosin (FLOMAX) 0.4 MG CAPS capsule Take 1 capsule (0.4 mg total) by mouth daily. 30 capsule 0   No current facility-administered  medications on file prior to visit.     No Known Allergies  History  Alcohol Use  . Yes    Comment: occasionally    History  Smoking Status  . Former Smoker  Smokeless Tobacco  . Former Neurosurgeon    Review of Systems  Constitutional: Negative.   HENT: Negative.   Eyes: Positive for blurred vision.  Respiratory: Negative.   Cardiovascular: Negative.   Gastrointestinal: Negative.   Genitourinary: Negative.   Musculoskeletal: Positive for joint pain.  Skin: Negative.   Neurological: Negative.   Endo/Heme/Allergies: Negative.   Psychiatric/Behavioral: Negative.     Objective   Vitals:   11/07/16 1438  BP: 122/67  Pulse: 71  Resp: 18  Temp: 98.7 F (37.1 C)    Physical Exam  Constitutional: He is oriented to person, place, and time and well-developed, well-nourished, and in no distress.  HENT:  Head: Normocephalic and atraumatic.  Cardiovascular: Normal rate, regular rhythm and normal heart sounds.  Exam reveals no gallop and no friction rub.   No murmur heard. Pulmonary/Chest: Effort normal and breath sounds normal. No respiratory distress. He has no wheezes. He has no rales.  Abdominal: Soft. Bowel sounds are normal. He  exhibits no distension. There is no tenderness. There is no rebound.  Neurological: He is alert and oriented to person, place, and time.  Skin: Skin is warm and dry.  Vitals reviewed.   Assessment  Need for screening colonoscopy Plan   Patient scheduled for a screening colonoscopy on 11/21/2016. The risks and benefits of the procedure including bleeding and perforation were fully explained to the patient, who gave informed consent.

## 2016-11-07 NOTE — Patient Instructions (Signed)
Colonoscopy, Adult A colonoscopy is an exam to look at the entire large intestine. During the exam, a lubricated, bendable tube is inserted into the anus and then passed into the rectum, colon, and other parts of the large intestine. A colonoscopy is often done as a part of normal colorectal screening or in response to certain symptoms, such as anemia, persistent diarrhea, abdominal pain, and blood in the stool. The exam can help screen for and diagnose medical problems, including:  Tumors.  Polyps.  Inflammation.  Areas of bleeding.  Tell a health care provider about:  Any allergies you have.  All medicines you are taking, including vitamins, herbs, eye drops, creams, and over-the-counter medicines.  Any problems you or family members have had with anesthetic medicines.  Any blood disorders you have.  Any surgeries you have had.  Any medical conditions you have.  Any problems you have had passing stool. What are the risks? Generally, this is a safe procedure. However, problems may occur, including:  Bleeding.  A tear in the intestine.  A reaction to medicines given during the exam.  Infection (rare).  What happens before the procedure? Eating and drinking restrictions Follow instructions from your health care provider about eating and drinking, which may include:  A few days before the procedure - follow a low-fiber diet. Avoid nuts, seeds, dried fruit, raw fruits, and vegetables.  1-3 days before the procedure - follow a clear liquid diet. Drink only clear liquids, such as clear broth or bouillon, black coffee or tea, clear juice, clear soft drinks or sports drinks, gelatin dessert, and popsicles. Avoid any liquids that contain red or purple dye.  On the day of the procedure - do not eat or drink anything during the 2 hours before the procedure, or within the time period that your health care provider recommends.  Bowel prep If you were prescribed an oral bowel prep  to clean out your colon:  Take it as told by your health care provider. Starting the day before your procedure, you will need to drink a large amount of medicated liquid. The liquid will cause you to have multiple loose stools until your stool is almost clear or light green.  If your skin or anus gets irritated from diarrhea, you may use these to relieve the irritation: ? Medicated wipes, such as adult wet wipes with aloe and vitamin E. ? A skin soothing-product like petroleum jelly.  If you vomit while drinking the bowel prep, take a break for up to 60 minutes and then begin the bowel prep again. If vomiting continues and you cannot take the bowel prep without vomiting, call your health care provider.  General instructions  Ask your health care provider about changing or stopping your regular medicines. This is especially important if you are taking diabetes medicines or blood thinners.  Plan to have someone take you home from the hospital or clinic. What happens during the procedure?  An IV tube may be inserted into one of your veins.  You will be given medicine to help you relax (sedative).  To reduce your risk of infection: ? Your health care team will wash or sanitize their hands. ? Your anal area will be washed with soap.  You will be asked to lie on your side with your knees bent.  Your health care provider will lubricate a long, thin, flexible tube. The tube will have a camera and a light on the end.  The tube will be inserted into your   anus.  The tube will be gently eased through your rectum and colon.  Air will be delivered into your colon to keep it open. You may feel some pressure or cramping.  The camera will be used to take images during the procedure.  A small tissue sample may be removed from your body to be examined under a microscope (biopsy). If any potential problems are found, the tissue will be sent to a lab for testing.  If small polyps are found, your  health care provider may remove them and have them checked for cancer cells.  The tube that was inserted into your anus will be slowly removed. The procedure may vary among health care providers and hospitals. What happens after the procedure?  Your blood pressure, heart rate, breathing rate, and blood oxygen level will be monitored until the medicines you were given have worn off.  Do not drive for 24 hours after the exam.  You may have a small amount of blood in your stool.  You may pass gas and have mild abdominal cramping or bloating due to the air that was used to inflate your colon during the exam.  It is up to you to get the results of your procedure. Ask your health care provider, or the department performing the procedure, when your results will be ready. This information is not intended to replace advice given to you by your health care provider. Make sure you discuss any questions you have with your health care provider. Document Released: 01/21/2000 Document Revised: 11/24/2015 Document Reviewed: 04/06/2015 Elsevier Interactive Patient Education  2018 [employer]  

## 2016-11-21 ENCOUNTER — Encounter (HOSPITAL_COMMUNITY): Payer: Self-pay | Admitting: *Deleted

## 2016-11-21 ENCOUNTER — Encounter (HOSPITAL_COMMUNITY)
Admission: RE | Disposition: A | Discharge status: Home / Self Care | Payer: Self-pay | Source: Ambulatory Visit | Attending: General Surgery

## 2016-11-21 ENCOUNTER — Ambulatory Visit (HOSPITAL_COMMUNITY)
Admission: RE | Admit: 2016-11-21 | Discharge: 2016-11-21 | Disposition: A | Discharge status: Home / Self Care | Payer: Medicare Other | Source: Ambulatory Visit | Attending: General Surgery | Admitting: General Surgery

## 2016-11-21 DIAGNOSIS — Z87891 Personal history of nicotine dependence: Secondary | ICD-10-CM | POA: Insufficient documentation

## 2016-11-21 DIAGNOSIS — K5730 Diverticulosis of large intestine without perforation or abscess without bleeding: Secondary | ICD-10-CM

## 2016-11-21 DIAGNOSIS — F419 Anxiety disorder, unspecified: Secondary | ICD-10-CM | POA: Diagnosis not present

## 2016-11-21 DIAGNOSIS — Z9049 Acquired absence of other specified parts of digestive tract: Secondary | ICD-10-CM | POA: Diagnosis not present

## 2016-11-21 DIAGNOSIS — Z1211 Encounter for screening for malignant neoplasm of colon: Secondary | ICD-10-CM | POA: Diagnosis not present

## 2016-11-21 DIAGNOSIS — Z7952 Long term (current) use of systemic steroids: Secondary | ICD-10-CM | POA: Insufficient documentation

## 2016-11-21 DIAGNOSIS — Z9189 Other specified personal risk factors, not elsewhere classified: Secondary | ICD-10-CM

## 2016-11-21 DIAGNOSIS — Z79899 Other long term (current) drug therapy: Secondary | ICD-10-CM | POA: Insufficient documentation

## 2016-11-21 DIAGNOSIS — J449 Chronic obstructive pulmonary disease, unspecified: Secondary | ICD-10-CM | POA: Diagnosis not present

## 2016-11-21 DIAGNOSIS — F329 Major depressive disorder, single episode, unspecified: Secondary | ICD-10-CM | POA: Insufficient documentation

## 2016-11-21 HISTORY — PX: COLONOSCOPY: SHX5424

## 2016-11-21 SURGERY — COLONOSCOPY
Anesthesia: Moderate Sedation

## 2016-11-21 MED ORDER — MEPERIDINE HCL 50 MG/ML IJ SOLN
INTRAMUSCULAR | Status: AC
  Filled 2016-11-21: qty 1

## 2016-11-21 MED ORDER — MEPERIDINE HCL 50 MG/ML IJ SOLN
INTRAMUSCULAR | Status: DC | PRN
  Administered 2016-11-21: 50 mg via INTRAVENOUS

## 2016-11-21 MED ORDER — SODIUM CHLORIDE 0.9 % IV SOLN
INTRAVENOUS | Status: DC
Start: 2016-11-21 — End: 2016-11-21
  Administered 2016-11-21: 07:00:00 via INTRAVENOUS

## 2016-11-21 MED ORDER — STERILE WATER FOR IRRIGATION IR SOLN
Status: DC | PRN
  Administered 2016-11-21: 2.5 mL

## 2016-11-21 MED ORDER — MIDAZOLAM HCL 5 MG/5ML IJ SOLN
INTRAMUSCULAR | Status: AC
  Filled 2016-11-21: qty 5

## 2016-11-21 MED ORDER — MIDAZOLAM HCL 5 MG/5ML IJ SOLN
INTRAMUSCULAR | Status: DC | PRN
  Administered 2016-11-21: 1 mg via INTRAVENOUS
  Administered 2016-11-21: 3 mg via INTRAVENOUS

## 2016-11-21 NOTE — Interval H&P Note (Signed)
History and Physical Interval Note:  11/21/2016 7:28 AM  [name]  has presented today for surgery, with the diagnosis of screening  The various methods of treatment have been discussed with the patient and family. After consideration of risks, benefits and other options for treatment, the patient has consented to  Procedure(s): COLONOSCOPY (N/A) as a surgical intervention .  The patient's history has been reviewed, patient examined, no change in status, stable for surgery.  I have reviewed the patient's chart and labs.  Questions were answered to the patient's satisfaction.     Franky Macho

## 2016-11-21 NOTE — Op Note (Signed)
[facility] Patient Name: [name] Procedure Date: 11/21/2016 7:13 AM MRN: [id number] Date of Birth: 1950/11/25 Attending MD: Franky Macho , MD CSN: [id number] Age: 66 Admit Type: Outpatient Procedure:                Colonoscopy Indications:              Screening for colorectal malignant neoplasm Providers:                Franky Macho, MD, Virgie Dad, Nena Polio, RN Referring MD:              Medicines:                Midazolam 4 mg IV, Meperidine 50 mg IV Complications:            No immediate complications. Estimated blood loss:                            None. Estimated Blood Loss:     Estimated blood loss: none. Procedure:                Pre-Anesthesia Assessment:                           - Prior to the procedure, a History and Physical                            was performed, and patient medications and                            allergies were reviewed. The patient is competent.                            The risks and benefits of the procedure and the                            sedation options and risks were discussed with the                            patient. All questions were answered and informed                            consent was obtained. Patient identification and                            proposed procedure were verified by the physician,                            the nurse and the technician in the endoscopy                            suite. Mental Status Examination: alert and                            oriented. Airway Examination: normal oropharyngeal  airway and neck mobility. Respiratory Examination:                            clear to auscultation. CV Examination: RRR, no                            murmurs, no S3 or S4. Prophylactic Antibiotics: The                            patient does not require prophylactic antibiotics.                            Prior Anticoagulants: The patient has taken no                 previous anticoagulant or antiplatelet agents. ASA                            Grade Assessment: II - A patient with mild systemic                            disease. After reviewing the risks and benefits,                            the patient was deemed in satisfactory condition to                            undergo the procedure. The anesthesia plan was to                            use moderate sedation / analgesia (conscious                            sedation). Immediately prior to administration of                            medications, the patient was re-assessed for                            adequacy to receive sedatives. The heart rate,                            respiratory rate, oxygen saturations, blood                            pressure, adequacy of pulmonary ventilation, and                            response to care were monitored throughout the                            procedure. The physical status of the patient was                            re-assessed after  the procedure.                           After obtaining informed consent, the colonoscope                            was passed under direct vision. Throughout the                            procedure, the patient's blood pressure, pulse, and                            oxygen saturations were monitored continuously. The                            [id number] ([id number]) scope was introduced through                            the anus and advanced to the the cecum, identified                            by the appendiceal orifice, ileocecal valve and                            palpation. No anatomical landmarks were                            photographed. The entire colon was well visualized.                            The colonoscopy was performed without difficulty.                            The patient tolerated the procedure well. The                            quality of the bowel preparation  was adequate. The                            total duration of the procedure was 8 minutes. Scope In: 7:33:49 AM Scope Out: 7:41:01 AM Scope Withdrawal Time: 0 hours 3 minutes 36 seconds  Total Procedure Duration: 0 hours 7 minutes 12 seconds  Findings:      The perianal and digital rectal examinations were normal.      A few small-mouthed diverticula were found in the descending colon.       Estimated blood loss: none.      The exam was otherwise without abnormality on direct and retroflexion       views. Impression:               - Diverticulosis in the descending colon.                           - The examination was otherwise normal on direct  and retroflexion views.                           - No specimens collected. Moderate Sedation:      Moderate (conscious) sedation was administered by the endoscopy nurse       and supervised by the endoscopist. The following parameters were       monitored: oxygen saturation, heart rate, blood pressure, and response       to care. Recommendation:           - Patient has a contact number available for                            emergencies. The signs and symptoms of potential                            delayed complications were discussed with the                            patient. Return to normal activities tomorrow.                            Written discharge instructions were provided to the                            patient.                           - Written discharge instructions were provided to                            the patient.                           - The signs and symptoms of potential delayed                            complications were discussed with the patient.                           - Patient has a contact number available for                            emergencies.                           - Return to normal activities tomorrow.                           - Resume previous diet.                            - Continue present medications.                           - No repeat colonoscopy due to age. Procedure Code(s):        --- Professional ---  W1191, Colorectal cancer screening; colonoscopy on                            individual not meeting criteria for high risk Diagnosis Code(s):        --- Professional ---                           Z12.11, Encounter for screening for malignant                            neoplasm of colon                           K57.30, Diverticulosis of large intestine without                            perforation or abscess without bleeding CPT copyright 2016 [employer]. All rights reserved. The codes documented in this report are preliminary and upon coder review may  be revised to meet current compliance requirements. Franky Macho, MD Franky Macho, MD 11/21/2016 7:47:56 AM This report has been signed electronically. Number of Addenda: 0

## 2016-11-21 NOTE — Discharge Instructions (Signed)
Colonoscopy, Adult, Care After  This sheet gives you information about how to care for yourself after your procedure. Your health care provider may also give you more specific instructions. If you have problems or questions, contact your health care provider.  What can I expect after the procedure?  After the procedure, it is common to have:  · A small amount of blood in your stool for 24 hours after the procedure.  · Some gas.  · Mild abdominal cramping or bloating.    Follow these instructions at home:  General instructions    · For the first 24 hours after the procedure:  ? Do not drive or use machinery.  ? Do not sign important documents.  ? Do not drink alcohol.  ? Do your regular daily activities at a slower pace than normal.  ? Eat soft, easy-to-digest foods.  ? Rest often.  · Take over-the-counter or prescription medicines only as told by your health care provider.  · It is up to you to get the results of your procedure. Ask your health care provider, or the department performing the procedure, when your results will be ready.  Relieving cramping and bloating  · Try walking around when you have cramps or feel bloated.  · Apply heat to your abdomen as told by your health care provider. Use a heat source that your health care provider recommends, such as a moist heat pack or a heating pad.  ? Place a towel between your skin and the heat source.  ? Leave the heat on for 20-30 minutes.  ? Remove the heat if your skin turns bright red. This is especially important if you are unable to feel pain, heat, or cold. You may have a greater risk of getting burned.  Eating and drinking  · Drink enough fluid to keep your urine clear or pale yellow.  · Resume your normal diet as instructed by your health care provider. Avoid heavy or fried foods that are hard to digest.  · Avoid drinking alcohol for as long as instructed by your health care provider.  Contact a health care provider if:  · You have blood in your stool 2-3  days after the procedure.  Get help right away if:  · You have more than a small spotting of blood in your stool.  · You pass large blood clots in your stool.  · Your abdomen is swollen.  · You have nausea or vomiting.  · You have a fever.  · You have increasing abdominal pain that is not relieved with medicine.  This information is not intended to replace advice given to you by your health care provider. Make sure you discuss any questions you have with your health care provider.  Document Released: 09/07/2003 Document Revised: 10/18/2015 Document Reviewed: 04/06/2015  Elsevier Interactive Patient Education © 2018 [employer]

## 2016-11-23 DIAGNOSIS — E785 Hyperlipidemia, unspecified: Secondary | ICD-10-CM

## 2016-11-23 HISTORY — DX: Hyperlipidemia, unspecified: E78.5

## 2016-11-24 ENCOUNTER — Encounter (HOSPITAL_COMMUNITY): Payer: Self-pay | Admitting: General Surgery

## 2016-12-07 DIAGNOSIS — M353 Polymyalgia rheumatica: Secondary | ICD-10-CM | POA: Insufficient documentation

## 2016-12-13 ENCOUNTER — Other Ambulatory Visit: Payer: Self-pay | Admitting: Family Medicine

## 2017-02-22 DIAGNOSIS — H2512 Age-related nuclear cataract, left eye: Secondary | ICD-10-CM | POA: Diagnosis not present

## 2017-02-22 DIAGNOSIS — H25012 Cortical age-related cataract, left eye: Secondary | ICD-10-CM | POA: Diagnosis not present

## 2017-03-04 ENCOUNTER — Encounter (HOSPITAL_COMMUNITY): Payer: Self-pay

## 2017-03-04 ENCOUNTER — Other Ambulatory Visit: Payer: Self-pay

## 2017-03-04 ENCOUNTER — Emergency Department (HOSPITAL_COMMUNITY)
Admission: EM | Admit: 2017-03-04 | Discharge: 2017-03-04 | Disposition: A | Discharge status: Home / Self Care | Payer: PPO | Attending: Emergency Medicine | Admitting: Emergency Medicine

## 2017-03-04 DIAGNOSIS — S29019A Strain of muscle and tendon of unspecified wall of thorax, initial encounter: Secondary | ICD-10-CM

## 2017-03-04 DIAGNOSIS — Y929 Unspecified place or not applicable: Secondary | ICD-10-CM | POA: Insufficient documentation

## 2017-03-04 DIAGNOSIS — Z79899 Other long term (current) drug therapy: Secondary | ICD-10-CM | POA: Diagnosis not present

## 2017-03-04 DIAGNOSIS — J449 Chronic obstructive pulmonary disease, unspecified: Secondary | ICD-10-CM | POA: Insufficient documentation

## 2017-03-04 DIAGNOSIS — S29012A Strain of muscle and tendon of back wall of thorax, initial encounter: Secondary | ICD-10-CM | POA: Diagnosis not present

## 2017-03-04 DIAGNOSIS — M62838 Other muscle spasm: Secondary | ICD-10-CM

## 2017-03-04 DIAGNOSIS — M62830 Muscle spasm of back: Secondary | ICD-10-CM | POA: Insufficient documentation

## 2017-03-04 DIAGNOSIS — X58XXXA Exposure to other specified factors, initial encounter: Secondary | ICD-10-CM | POA: Insufficient documentation

## 2017-03-04 DIAGNOSIS — J45909 Unspecified asthma, uncomplicated: Secondary | ICD-10-CM | POA: Diagnosis not present

## 2017-03-04 DIAGNOSIS — Y939 Activity, unspecified: Secondary | ICD-10-CM | POA: Diagnosis not present

## 2017-03-04 DIAGNOSIS — M549 Dorsalgia, unspecified: Secondary | ICD-10-CM

## 2017-03-04 DIAGNOSIS — Y999 Unspecified external cause status: Secondary | ICD-10-CM | POA: Diagnosis not present

## 2017-03-04 DIAGNOSIS — S299XXA Unspecified injury of thorax, initial encounter: Secondary | ICD-10-CM | POA: Diagnosis present

## 2017-03-04 DIAGNOSIS — Z87891 Personal history of nicotine dependence: Secondary | ICD-10-CM | POA: Diagnosis not present

## 2017-03-04 MED ORDER — PREDNISONE 20 MG PO TABS: ORAL_TABLET | ORAL | 0 refills | Status: DC

## 2017-03-04 MED ORDER — PREDNISONE 50 MG PO TABS
60.0000 mg | ORAL_TABLET | Freq: Once | ORAL | Status: AC
  Administered 2017-03-04: 60 mg via ORAL
  Filled 2017-03-04: qty 1

## 2017-03-04 MED ORDER — TRAMADOL HCL 50 MG PO TABS
50.0000 mg | ORAL_TABLET | Freq: Once | ORAL | Status: AC
Start: 2017-03-04 — End: 2017-03-04
  Administered 2017-03-04: 50 mg via ORAL
  Filled 2017-03-04: qty 1

## 2017-03-04 MED ORDER — TRAMADOL HCL 50 MG PO TABS: 50.0000 mg | ORAL_TABLET | Freq: Four times a day (QID) | ORAL | 0 refills | Status: DC | PRN

## 2017-03-04 MED ORDER — METHOCARBAMOL 500 MG PO TABS: 1000.0000 mg | ORAL_TABLET | Freq: Three times a day (TID) | ORAL | 0 refills | Status: DC | PRN

## 2017-03-04 NOTE — ED Provider Notes (Signed)
[facility] EMERGENCY DEPARTMENT Provider Note   CSN: [id number] Arrival date & time: 03/04/17  [id number]     History   Chief Complaint Chief Complaint  Patient presents with  . Shoulder Pain    HPI [name] is a 67 y.o. male.  Patient c/o left upper back/scapular area pain for the past 4-5 days. Moderate, dull, radiating, worse w certain movements and position changes of neck and left shoulder. Patient notes similar episode a few years ago that resolved after some chiropractic treatments. Denies hx ddd. Pain occasional radiates, burning, moderate, pain to left arm. No arm numbness/weakness. Works as Visual merchandiserfarmer. Denies specific injury or strain. ?hx polymyalgia rheumatica. Takes prednisone 10 mg a day. No fever or chills. Does not feel sick or ill. Right hand dominant, but does a lot of lifting/carrying with left arm.    The history is provided by the patient and a relative.  Shoulder Pain   Pertinent negatives include no numbness.    Past Medical History:  Diagnosis Date  . Anxiety   . Arthritis    arthritis  . Asthma   . COPD (chronic obstructive pulmonary disease) (HCC)   . Depression   . 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 Special screening for malignant neoplasms, colon   . Diverticulosis of large intestine without diverticulitis   . 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 History of gout 06/12/2016  . Hard of hearing 06/12/2016  . Asthma in adult, severe persistent, uncomplicated 06/12/2016  . Dysthymia 06/12/2016  . COPD (chronic obstructive pulmonary disease) (HCC)     Past Surgical History:  Procedure Laterality Date  . APPENDECTOMY    . CHOLECYSTECTOMY    . COLONOSCOPY N/A 11/21/2016   Procedure: COLONOSCOPY;  Surgeon: Franky MachoJenkins, Mark, MD;  Location: [facility];  Service: Gastroenterology;  Laterality: N/A;  . HAND SURGERY         Home Medications    Prior to Admission medications   Medication Sig Start  Date End Date Taking? Authorizing Provider  ANORO ELLIPTA 62.5-25 MCG/INH AEPB Inhale 1 puff into the lungs daily. 02/28/16  Yes [provider]  Besifloxacin HCl (BESIVANCE) 0.6 % SUSP Place 1 drop into the left eye 3 (three) times daily. 01/09/17  Yes [provider]  Bromfenac Sodium (PROLENSA) 0.07 % SOLN Place 1 drop into the left eye at bedtime. 01/09/17  Yes [provider]  budesonide-formoterol (SYMBICORT) 160-4.5 MCG/ACT inhaler Inhale 2 puffs into the lungs 2 (two) times daily. Patient taking differently: Inhale 2 puffs into the lungs daily.  06/12/16  Yes Eustace MooreNelson, Yvonne Sue, MD  Difluprednate (DUREZOL) 0.05 % EMUL Place 1 drop into the left eye 3 (three) times daily. 01/09/17  Yes [provider]  PARoxetine (PAXIL) 20 MG tablet Take 20 mg by mouth at bedtime.  03/08/15  Yes [provider]  predniSONE (DELTASONE) 10 MG tablet Take 10 mg by mouth at bedtime.    Yes [provider]  PROAIR HFA 108 (90 Base) MCG/ACT inhaler Inhale 2 puffs into the lungs every 4 (four) hours as needed for wheezing or shortness of breath.  03/09/15  Yes [provider]  sildenafil (REVATIO) 20 MG tablet Take 20-100 mg by mouth every 30 (thirty) days.   Yes [provider]    Family History Family History  Problem Relation Age of Onset  . Diabetes Mother   . Heart disease Mother   . Heart disease  Father        old age at 56  . Heart disease Brother 55       cabg  . Early death Son 87       MVA    Social History Social History   Tobacco Use  . Smoking status: Former Smoker    Packs/day: 1.00    Years: 10.00    Pack years: 10.00    Types: Cigarettes    Last attempt to quit: 02/06/1984    Years since quitting: 33.0  . Smokeless tobacco: Former Engineer, water Use Topics  . Alcohol use: Yes    Comment: occasionally  . Drug use: No     Allergies   Patient has no known allergies.   Review of Systems Review of Systems    Constitutional: Negative for fever.  Respiratory: Negative for cough and shortness of breath.   Cardiovascular: Negative for chest pain.  Gastrointestinal: Negative for abdominal pain.  Musculoskeletal: Positive for back pain. Negative for neck pain.  Skin: Negative for rash.  Neurological: Negative for weakness, numbness and headaches.     Physical Exam Updated Vital Signs BP 124/80   Pulse 61   Temp 97.7 F (36.5 C) (Oral)   Resp 14   Ht 1.88 m (6\' 2" )   Wt 99.8 kg (220 lb)   SpO2 95%   BMI 28.25 kg/m   Physical Exam  Constitutional: He appears well-developed and well-nourished. No distress.  HENT:  Head: Atraumatic.  Eyes: Conjunctivae are normal.  Neck: Normal range of motion. Neck supple. No tracheal deviation present.  Cardiovascular: Normal rate, regular rhythm, normal heart sounds and intact distal pulses. Exam reveals no gallop and no friction rub.  No murmur heard. Pulmonary/Chest: Effort normal and breath sounds normal. No accessory muscle usage. No respiratory distress. He exhibits no tenderness.  Abdominal: He exhibits no distension. There is no tenderness.  Musculoskeletal: He exhibits no edema.  C/T spine non tender, aligned, no step off. Musculature of patients back and shoulder is much more developed on left. Left trapezius and medial left scapula muscular tenderness/spasm.  Normal rom left shoulder without pain, distal pulses palp. No arm swelling.    Neurological: He is alert.  LUE motor intact, stre 5/5. sens grossly intact.   Skin: Skin is warm and dry. No rash noted. He is not diaphoretic.  Psychiatric: He has a normal mood and affect.  Nursing note and vitals reviewed.    ED Treatments / Results  Labs (all labs ordered are listed, but only abnormal results are displayed) Labs Reviewed - No data to display  EKG  EKG Interpretation  Date/Time:  Sunday March 04 2017 10:12:25 EST Ventricular Rate:  60 PR Interval:    QRS Duration: 97 QT  Interval:  420 QTC Calculation: 420 R Axis:   61 Text Interpretation:  Sinus rhythm RSR' in V1 or V2, right VCD or RVH Nonspecific ST and T wave abnormality Inferior leads When compared with ECG of 03/04/2016 No significant change was found Confirmed by Samuel Jester [phone]) on 03/04/2017 10:15:34 AM       Radiology No results found.  Procedures Procedures (including critical care time)  Medications Ordered in ED Medications  predniSONE (DELTASONE) tablet 60 mg (not administered)  traMADol (ULTRAM) tablet 50 mg (not administered)     Initial Impression / Assessment and Plan / ED Course  I have reviewed the triage vital signs and the nursing notes.  Pertinent labs & imaging results that were  available during my care of the patient were reviewed by me and considered in my medical decision making (see chart for details).  Reviewed nursing notes and prior charts for additional history.   Patient indicates has not taken any meds today for pain.   Prednisone 60 mg po. Ultram po.   rx ultram and robaxin for home. Discussed heat therapy, massage, and PT as possible tx options.   Discussed diff dx including muscular strain/spasm, ddd/herniated disc, nerve impingement.   rec close pcp f/u.     Final Clinical Impressions(s) / ED Diagnoses   Final diagnoses:  None    ED Discharge Orders    None       Cathren Laine, MD 03/04/17 1226

## 2017-03-04 NOTE — Discharge Instructions (Signed)
It was our pleasure to provide your ER care today - we hope that you feel better.  Take increased dose prednisone as prescribed, and then taper back to your normal dose.   Take ultram as need for pain, and robaxin as need for muscle spasm - no driving when taking these medications.  You may also try heat therapy, massage, and physical therapy for symptom relief.   Follow up with your doctor in the next 1-2 weeks.  If symptoms persist despite therapy, discuss possible advanced imaging study then.   Return to ER if worse, new symptoms, fevers, arm numbness/weakness, other concern.

## 2017-03-04 NOTE — ED Triage Notes (Signed)
Reports of pain in left scapula that radiates down left arm. Denies chest pain/shortness of breath. Pain is worse with movement.

## 2017-04-04 ENCOUNTER — Encounter: Payer: Self-pay | Admitting: Family Medicine

## 2017-04-04 DIAGNOSIS — R7303 Prediabetes: Secondary | ICD-10-CM | POA: Insufficient documentation

## 2017-04-04 DIAGNOSIS — Z8639 Personal history of other endocrine, nutritional and metabolic disease: Secondary | ICD-10-CM | POA: Insufficient documentation

## 2017-04-16 ENCOUNTER — Encounter: Payer: Self-pay | Admitting: Family Medicine

## 2017-07-11 DIAGNOSIS — Z7952 Long term (current) use of systemic steroids: Secondary | ICD-10-CM | POA: Insufficient documentation

## 2017-07-11 DIAGNOSIS — M353 Polymyalgia rheumatica: Secondary | ICD-10-CM | POA: Diagnosis not present

## 2017-07-11 DIAGNOSIS — M1812 Unilateral primary osteoarthritis of first carpometacarpal joint, left hand: Secondary | ICD-10-CM | POA: Diagnosis not present

## 2017-09-10 DIAGNOSIS — M1812 Unilateral primary osteoarthritis of first carpometacarpal joint, left hand: Secondary | ICD-10-CM | POA: Diagnosis not present

## 2017-09-10 DIAGNOSIS — M353 Polymyalgia rheumatica: Secondary | ICD-10-CM | POA: Diagnosis not present

## 2017-09-10 DIAGNOSIS — Z7952 Long term (current) use of systemic steroids: Secondary | ICD-10-CM | POA: Diagnosis not present

## 2017-10-08 ENCOUNTER — Encounter (HOSPITAL_COMMUNITY): Payer: Self-pay | Admitting: Emergency Medicine

## 2017-10-08 ENCOUNTER — Emergency Department (HOSPITAL_COMMUNITY): Payer: PPO

## 2017-10-08 ENCOUNTER — Emergency Department (HOSPITAL_COMMUNITY)
Admission: EM | Admit: 2017-10-08 | Discharge: 2017-10-08 | Disposition: A | Discharge status: Home / Self Care | Payer: PPO | Attending: Emergency Medicine | Admitting: Emergency Medicine

## 2017-10-08 DIAGNOSIS — Z87891 Personal history of nicotine dependence: Secondary | ICD-10-CM | POA: Diagnosis not present

## 2017-10-08 DIAGNOSIS — M546 Pain in thoracic spine: Secondary | ICD-10-CM

## 2017-10-08 DIAGNOSIS — M549 Dorsalgia, unspecified: Secondary | ICD-10-CM | POA: Diagnosis not present

## 2017-10-08 DIAGNOSIS — M5412 Radiculopathy, cervical region: Secondary | ICD-10-CM | POA: Diagnosis not present

## 2017-10-08 DIAGNOSIS — M25512 Pain in left shoulder: Secondary | ICD-10-CM | POA: Diagnosis present

## 2017-10-08 DIAGNOSIS — J449 Chronic obstructive pulmonary disease, unspecified: Secondary | ICD-10-CM | POA: Insufficient documentation

## 2017-10-08 DIAGNOSIS — R079 Chest pain, unspecified: Secondary | ICD-10-CM | POA: Diagnosis not present

## 2017-10-08 DIAGNOSIS — Z79899 Other long term (current) drug therapy: Secondary | ICD-10-CM | POA: Insufficient documentation

## 2017-10-08 LAB — BASIC METABOLIC PANEL
Anion gap: 3 — ABNORMAL LOW (ref 5–15)
BUN: 17 mg/dL (ref 8–23)
CHLORIDE: 108 mmol/L (ref 98–111)
CO2: 28 mmol/L (ref 22–32)
CREATININE: 1.1 mg/dL (ref 0.61–1.24)
Calcium: 8.7 mg/dL — ABNORMAL LOW (ref 8.9–10.3)
GFR calc non Af Amer: >60 mL/min (ref >60)
Glucose, Bld: 141 mg/dL — ABNORMAL HIGH (ref 70–99)
POTASSIUM: 4 mmol/L (ref 3.5–5.1)
Sodium: 139 mmol/L (ref 135–145)

## 2017-10-08 LAB — CBC
HEMATOCRIT: 40.9 % (ref 39.0–52.0)
HEMOGLOBIN: 13.5 g/dL (ref 13.0–17.0)
MCH: 29.2 pg (ref 26.0–34.0)
MCHC: 33 g/dL (ref 30.0–36.0)
MCV: 88.5 fL (ref 78.0–100.0)
Platelets: 213 10*3/uL (ref 150–400)
RBC: 4.62 MIL/uL (ref 4.22–5.81)
RDW: 13.6 % (ref 11.5–15.5)
WBC: 9.2 10*3/uL (ref 4.0–10.5)

## 2017-10-08 LAB — I-STAT TROPONIN, ED: TROPONIN I, POC: 0 ng/mL (ref 0.00–0.08)

## 2017-10-08 MED ORDER — HYDROCODONE-ACETAMINOPHEN 5-325 MG PO TABS: 1.0000 | ORAL_TABLET | Freq: Four times a day (QID) | ORAL | 0 refills | Status: DC | PRN

## 2017-10-08 NOTE — Discharge Instructions (Addendum)
Continue your prednisone at the current dose.  Decided not to increase it.  Take the hydrocodone as needed for pain.  Make an appointment to follow-up with orthopedics.  Also recommend following up with your rheumatologist as well as your regular doctor.  CT scan of the cervical spine and thoracic spine without any definitive abnormalities.  But your symptoms in the neck and left arm are certainly consistent with a cervical radiculopathy.  Which means there is some nerve irritation.

## 2017-10-08 NOTE — ED Notes (Signed)
Pt returned from ct

## 2017-10-08 NOTE — ED Triage Notes (Signed)
Pt reports pain in left shoulder with left arm numbness in left arm x 3-4 days with hx of same.  Yesterday began having pain in middle of back which is unusual.  Denies injury.

## 2017-10-08 NOTE — ED Provider Notes (Signed)
[facility] EMERGENCY DEPARTMENT Provider Note   CSN: [id number] Arrival date & time: 10/08/17  1024     History   Chief Complaint Chief Complaint  Patient presents with  . Back Pain    HPI [name] is a 67 y.o. male.  Patient presenting with a complaint of left shoulder pain some left arm pain.  That has been present for about a week.  Also has some pain at the base of the left shoulder blade.  And then just recently started with that the lower thoracic back area with pain in that area.  Patient is followed by rheumatology for polymyalgia rheumatica.  He is on steroids.  Currently taking 5 mg a day.  He has been seen by them recently.  Their note states that he is sometimes noncompliant with steroids.  There is no history of any injury.  He says the neck shoulder discomfort more shoulder discomfort the neck is in the arm discomfort is been present on and off for a while definitely feels more comfortable with his left arm sitting on top of his head.  But no distinct weakness in the left fingers or any specific pattern of numbness.  No lower extremity symptoms.  No low back pain.  No anterior chest pain no shortness of breath no abdominal pain.     Past Medical History:  Diagnosis Date  . Anxiety   . Arthritis    arthritis  . Asthma   . COPD (chronic obstructive pulmonary disease) (HCC)   . Depression   . 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 Hyperlipemia 11/23/2016    Patient Active Problem List   Diagnosis Date Noted  . History of vitamin D deficiency 04/04/2017  . Prediabetes 04/04/2017  . 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 Hyperlipemia 11/23/2016  . Special screening for malignant neoplasms, colon   . Diverticulosis of large intestine without diverticulitis   . 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 History of gout 06/12/2016  . Hard of hearing 06/12/2016  . Asthma in adult, severe persistent, uncomplicated 06/12/2016  . Dysthymia 06/12/2016  . COPD  (chronic obstructive pulmonary disease) (HCC)     Past Surgical History:  Procedure Laterality Date  . APPENDECTOMY    . CHOLECYSTECTOMY    . COLONOSCOPY N/A 11/21/2016   Procedure: COLONOSCOPY;  Surgeon: Franky Macho, MD;  Location: [facility];  Service: Gastroenterology;  Laterality: N/A;  . HAND SURGERY          Home Medications    Prior to Admission medications   Medication Sig Start Date End Date Taking? Authorizing Provider  Aspirin-Salicylamide-Caffeine (BC HEADACHE POWDER PO) Take 1 Package by mouth daily as needed.   Yes [provider]  budesonide-formoterol (SYMBICORT) 160-4.5 MCG/ACT inhaler Inhale 2 puffs into the lungs 2 (two) times daily. Patient taking differently: Inhale 2 puffs into the lungs daily.  06/12/16  Yes Eustace Moore, MD  PARoxetine (PAXIL) 20 MG tablet Take 20 mg by mouth at bedtime.  03/08/15  Yes [provider]  predniSONE (DELTASONE) 5 MG tablet Take 5 mg by mouth daily with breakfast.    Yes [provider]  PROAIR HFA 108 (90 Base) MCG/ACT inhaler Inhale 2 puffs into the lungs every 4 (four) hours as needed for wheezing or shortness of breath.  03/09/15  Yes [provider]  sildenafil (REVATIO) 20 MG tablet Take 20-100 mg by mouth every 30 (thirty) days.   Yes [provider]  Ernestina Patches 62.5-25 MCG/INH AEPB  Inhale 1 puff into the lungs daily. 02/28/16   [provider]  HYDROcodone-acetaminophen (NORCO/VICODIN) 5-325 MG tablet Take 1-2 tablets by mouth every 6 (six) hours as needed. 10/08/17   Vanetta Mulders, MD    Family History Family History  Problem Relation Age of Onset  . Diabetes Mother   . Heart disease Mother   . Heart disease Father        old age at 23  . Heart disease Brother 40       cabg  . Early death Son 36       MVA    Social History Social History   Tobacco Use  . Smoking status: Former Smoker    Packs/day: 1.00    Years: 10.00    Pack years: 10.00     Types: Cigarettes    Last attempt to quit: 02/06/1984    Years since quitting: 33.6  . Smokeless tobacco: Former Engineer, water Use Topics  . Alcohol use: Yes    Comment: occasionally  . Drug use: No     Allergies   Patient has no known allergies.   Review of Systems Review of Systems  Constitutional: Negative for fever.  HENT: Negative for congestion.   Eyes: Negative for visual disturbance.  Respiratory: Negative for shortness of breath.   Cardiovascular: Negative for chest pain and leg swelling.  Gastrointestinal: Negative for abdominal pain.  Genitourinary: Negative for dysuria.  Musculoskeletal: Positive for back pain. Negative for neck pain.  Skin: Negative for rash.  Neurological: Positive for numbness. Negative for weakness.  Hematological: Does not bruise/bleed easily.  Psychiatric/Behavioral: Negative for confusion.     Physical Exam Updated Vital Signs BP 139/83   Pulse (!) 55   Temp 97.8 F (36.6 C) (Oral)   Resp 18   Ht 1.88 m (6\' 2" )   Wt 102.1 kg   SpO2 99%   BMI 28.89 kg/m   Physical Exam  Constitutional: He is oriented to person, place, and time. He appears well-developed and well-nourished. No distress.  HENT:  Head: Normocephalic and atraumatic.  Mouth/Throat: Oropharynx is clear and moist.  Eyes: Pupils are equal, round, and reactive to light. Conjunctivae and EOM are normal.  Neck: Neck supple.  Cardiovascular: Normal rate, regular rhythm and normal heart sounds.  Pulmonary/Chest: Effort normal and breath sounds normal. No respiratory distress.  Abdominal: Soft. Bowel sounds are normal. There is no tenderness.  Musculoskeletal: Normal range of motion. He exhibits tenderness. He exhibits no edema or deformity.  Tenderness to palpation point tenderness at around the T10-T11-T12 vertebrae.  And also a little bit to the left on the paraspinous muscle.  And also distinct other area of tenderness at the tip of the left shoulder blade.  The left  upper extremity good radial pulse about 1+ good cap refill.  Sensation grossly intact good motor function.  Patient does get relief of the symptoms in the left shoulder and arm when he rest his left hand on top of his head.  Neurological: He is alert and oriented to person, place, and time. No cranial nerve deficit or sensory deficit. He exhibits normal muscle tone. Coordination normal.  Skin: Skin is warm. No rash noted.  Nursing note and vitals reviewed.    ED Treatments / Results  Labs (all labs ordered are listed, but only abnormal results are displayed) Labs Reviewed  BASIC METABOLIC PANEL - Abnormal; Notable for the following components:      Result Value   Glucose, Bld  141 (*)    Calcium 8.7 (*)    Anion gap 3 (*)    All other components within normal limits  CBC  I-STAT TROPONIN, ED    EKG EKG Interpretation  Date/Time:  Monday October 08 2017 11:39:43 EDT Ventricular Rate:  60 PR Interval:    QRS Duration: 103 QT Interval:  422 QTC Calculation: 422 R Axis:   52 Text Interpretation:  Sinus rhythm RSR' in V1 or V2, right VCD or RVH Baseline wander in lead(s) II III aVL aVF V4 Interpretation limited secondary to artifact No significant change since last tracing Confirmed by Vanetta Mulders [phone]) on 10/08/2017 1:05:34 PM   Radiology Dg Chest 2 View  Result Date: 10/08/2017 CLINICAL DATA:  Chest pain EXAM: CHEST - 2 VIEW COMPARISON:  03/04/2016 FINDINGS: The heart size and mediastinal contours are within normal limits. Both lungs are clear. The visualized skeletal structures are unremarkable. IMPRESSION: No active cardiopulmonary disease. Electronically Signed   By: Marlan Palau M.D.   On: 10/08/2017 12:20   Ct Cervical Spine Wo Contrast  Result Date: 10/08/2017 CLINICAL DATA:  67 year old male with history of pain in the left shoulder and left arm numbness for the past 3-4 days. Pain in the middle of the back since yesterday. EXAM: CT CERVICAL SPINE WITHOUT CONTRAST  TECHNIQUE: Multidetector CT imaging of the cervical spine was performed without intravenous contrast. Multiplanar CT image reconstructions were also generated. COMPARISON:  No priors. FINDINGS: Alignment: Normal. Skull base and vertebrae: No acute fracture. No primary bone lesion or focal pathologic process. Soft tissues and spinal canal: No prevertebral fluid or swelling. No visible canal hematoma. Disc levels: Mild multilevel degenerative disc disease, most pronounced at C6-C7. Mild multilevel facet arthropathy bilaterally. Upper chest: Unremarkable. Other: None. IMPRESSION: 1. No acute abnormality of the cervical spine to account for the patient's symptoms. 2. Mild multilevel degenerative disc disease and cervical spondylosis, as above. Electronically Signed   By: Trudie Reed M.D.   On: 10/08/2017 14:45   Ct Thoracic Spine Wo Contrast  Result Date: 10/08/2017 CLINICAL DATA:  Suspected vertebral body fracture. Left shoulder pain and arm numbness. Mid back pain EXAM: CT THORACIC SPINE WITHOUT CONTRAST TECHNIQUE: Multidetector CT images of the thoracic were obtained using the standard protocol without intravenous contrast. COMPARISON:  None. FINDINGS: Alignment: Normal Vertebrae: No acute fracture or focal pathologic process. Paraspinal and other soft tissues: No acute or aggressive finding. Cholecystectomy. Disc levels: Diffuse thoracic disc narrowing and spondylotic spurring. No visible cord impingement. No bony foraminal stenosis. IMPRESSION: Multilevel spondylosis without acute finding. Electronically Signed   By: Marnee Spring M.D.   On: 10/08/2017 14:47    Procedures Procedures (including critical care time)  Medications Ordered in ED Medications - No data to display   Initial Impression / Assessment and Plan / ED Course  I have reviewed the triage vital signs and the nursing notes.  Pertinent labs & imaging results that were available during my care of the patient were reviewed by me  and considered in my medical decision making (see chart for details).    Patient's work-up was a dual approach.  Initially the concern was for some sort of atypical chest pain because of the left arm pain.  Cardiac work-up without any significant abnormalities.  And chest x-ray was negative.  EKG without any acute changes.  The other work-up was some concern for cervical radiculopathy.  MRI not available today the patient does not like MRIs that although he has not had  one because he is claustrophobic.  So CT cervical spine was done and CT thoracic spine was done for the lower thoracic symptoms as well.  Multiple levels of spondylolysis without any acute findings.  Nothing in the cervical spine to explain the left arm symptoms but they certainly sound radicular in nature.  Patient given referral to follow-up with orthopedics will continue his steroids.  Short course of hydrocodone given for the pain.  We will also follow back up with his rheumatologist.  Clinically it sounds as if patient is going to need an MRI of the cervical spine as an outpatient.   Final Clinical Impressions(s) / ED Diagnoses   Final diagnoses:  Cervical radiculopathy  Acute left-sided thoracic back pain    ED Discharge Orders         Ordered    HYDROcodone-acetaminophen (NORCO/VICODIN) 5-325 MG tablet  Every 6 hours PRN     10/08/17 1512           Vanetta Mulders, MD 10/08/17 1528

## 2017-12-10 DIAGNOSIS — M353 Polymyalgia rheumatica: Secondary | ICD-10-CM | POA: Diagnosis not present

## 2017-12-10 DIAGNOSIS — Z7952 Long term (current) use of systemic steroids: Secondary | ICD-10-CM | POA: Diagnosis not present

## 2017-12-10 DIAGNOSIS — M1A00X0 Idiopathic chronic gout, unspecified site, without tophus (tophi): Secondary | ICD-10-CM | POA: Diagnosis not present

## 2017-12-10 DIAGNOSIS — M1812 Unilateral primary osteoarthritis of first carpometacarpal joint, left hand: Secondary | ICD-10-CM | POA: Diagnosis not present

## 2017-12-19 DIAGNOSIS — J45909 Unspecified asthma, uncomplicated: Secondary | ICD-10-CM | POA: Diagnosis not present

## 2017-12-19 DIAGNOSIS — M109 Gout, unspecified: Secondary | ICD-10-CM | POA: Diagnosis not present

## 2017-12-19 DIAGNOSIS — F419 Anxiety disorder, unspecified: Secondary | ICD-10-CM | POA: Diagnosis not present

## 2017-12-19 DIAGNOSIS — M259 Joint disorder, unspecified: Secondary | ICD-10-CM | POA: Diagnosis not present

## 2017-12-31 IMAGING — DX DG CHEST 2V
2 series · 2 of 2 positions shown · non-contrast
Comparison: 09/02/2013 and earlier.

CLINICAL DATA: 64-year-old with current history of asthma
presenting with acute exacerbation with shortness of breath and
hypoxia.

EXAM:
CHEST  2 VIEW

[chest pa]
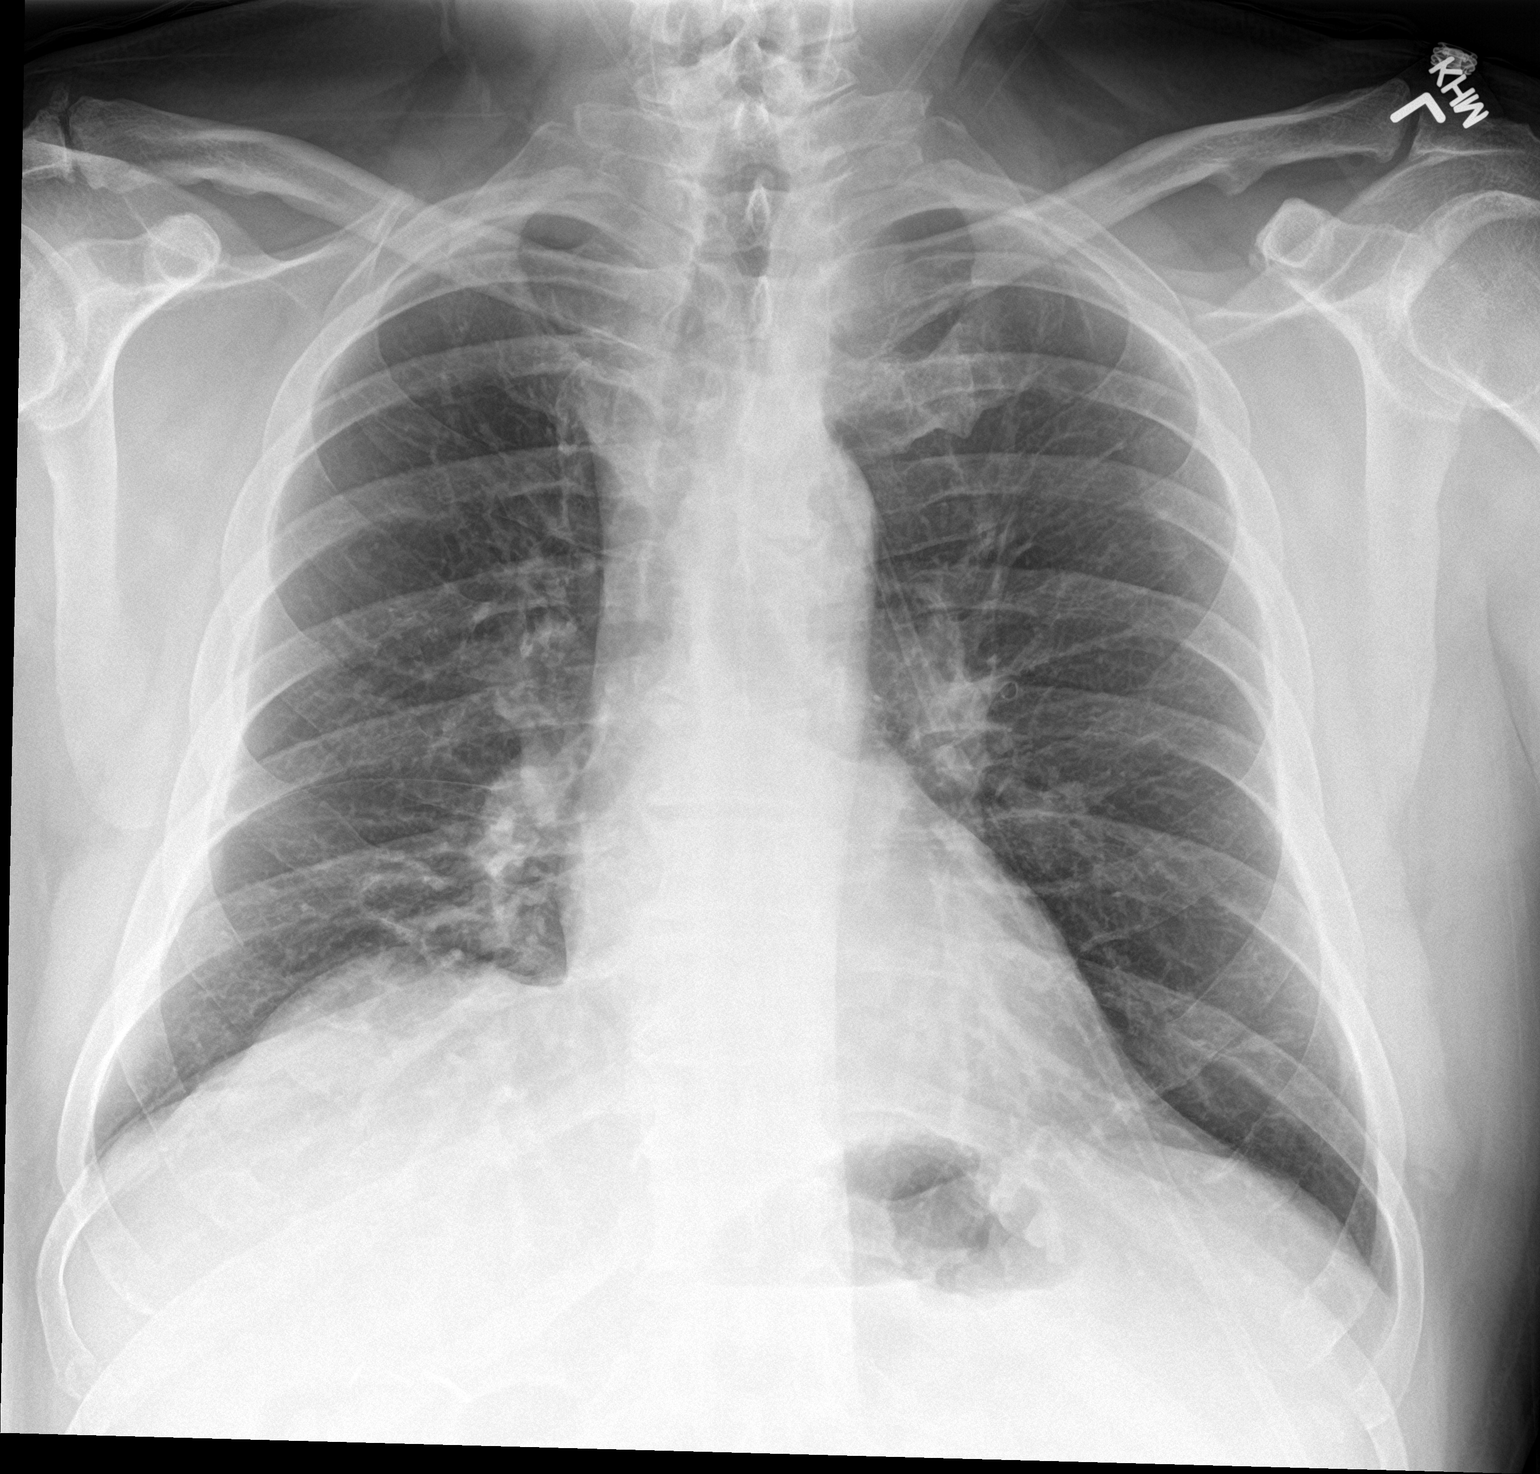

[chest lat]
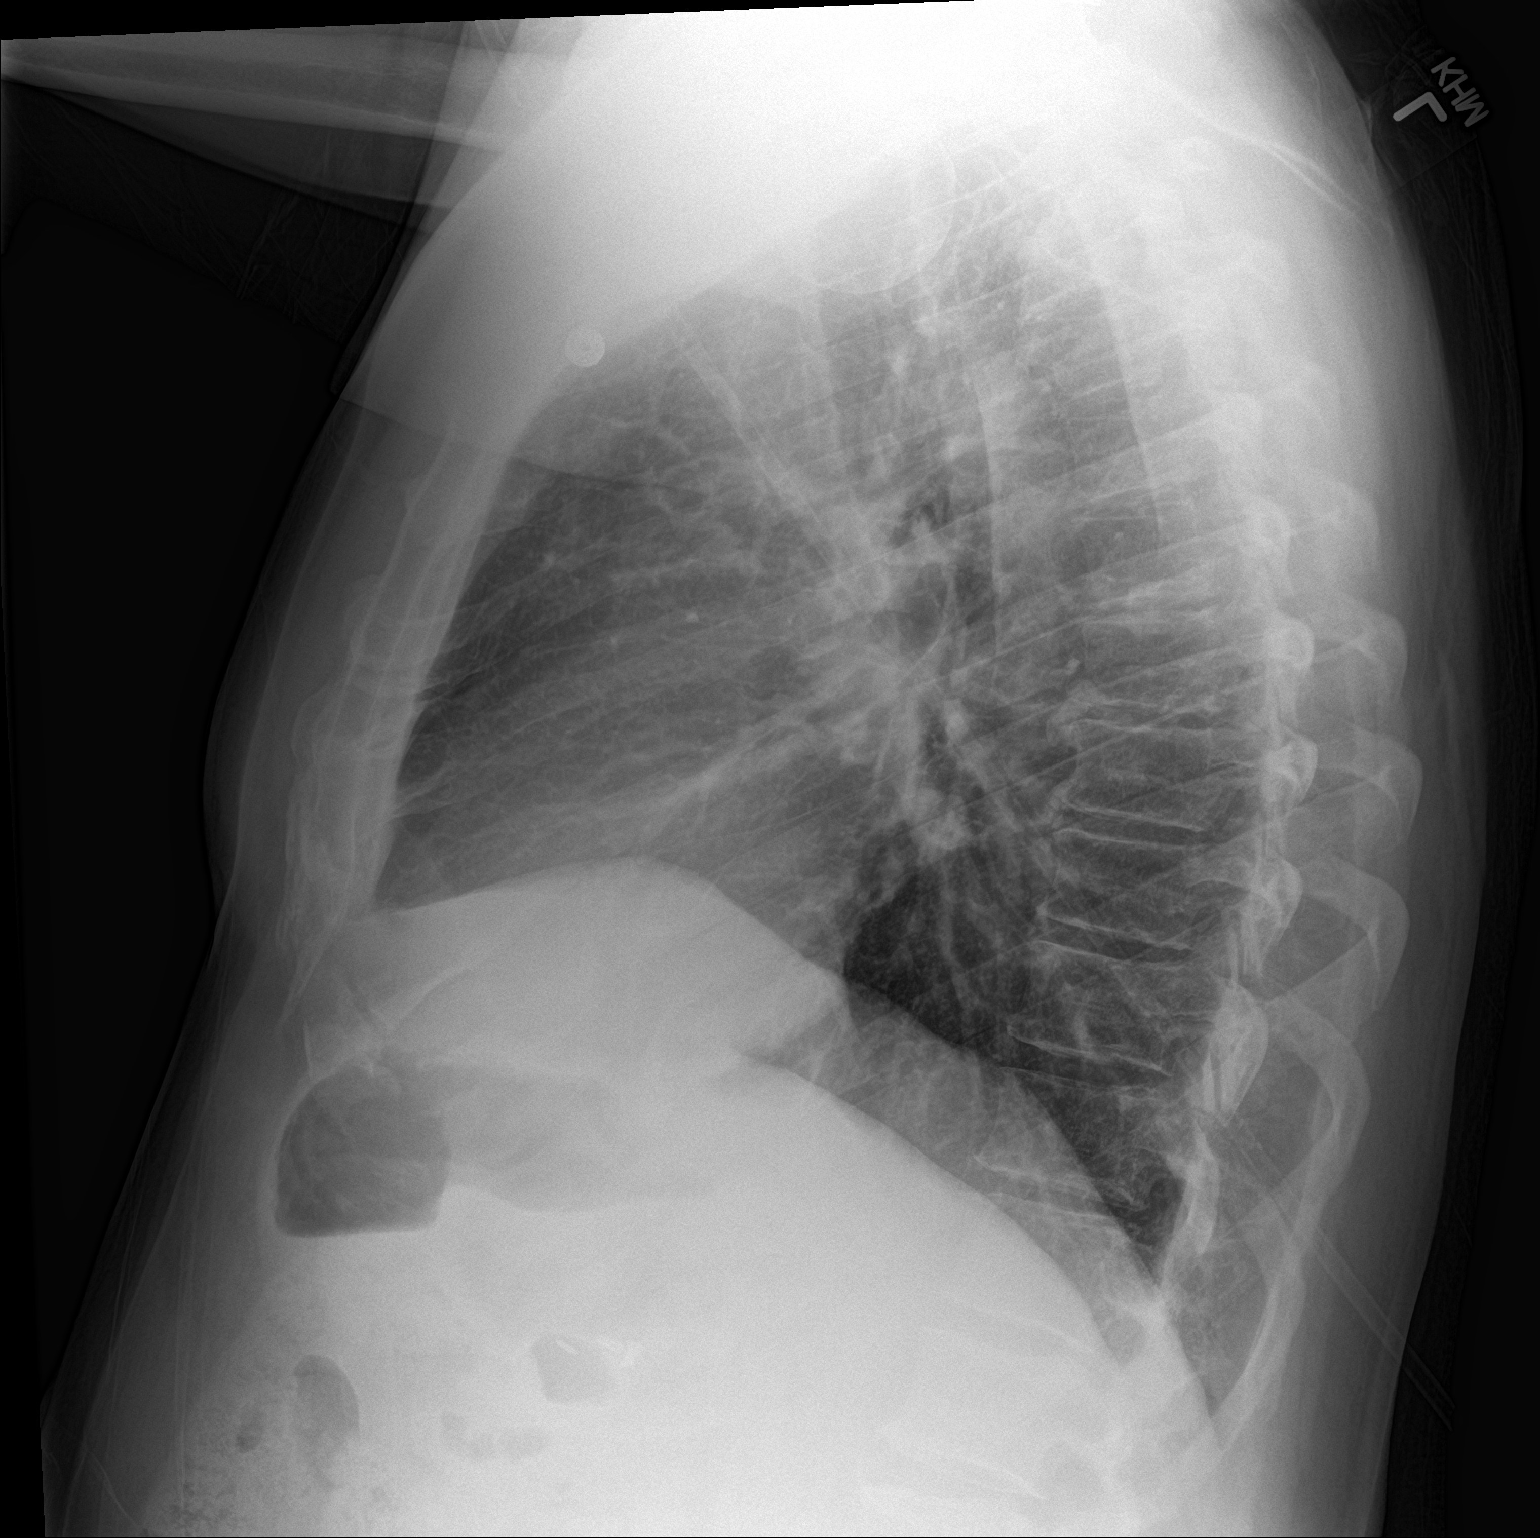

[2 of 2 positions shown; findings below may reference images not displayed]

FINDINGS: Cardiomediastinal silhouette unremarkable, unchanged. Lungs clear.
Bronchovascular markings normal. Pulmonary vascularity normal. No
visible pleural effusions. No pneumothorax. Mild eventration of the
right hemidiaphragm as noted previously. Mild degenerative changes
involving the thoracic spine.
IMPRESSION: No acute cardiopulmonary disease.  Stable examination.

## 2018-02-03 ENCOUNTER — Encounter (HOSPITAL_COMMUNITY): Payer: Self-pay | Admitting: Emergency Medicine

## 2018-02-03 ENCOUNTER — Other Ambulatory Visit: Payer: Self-pay

## 2018-02-03 ENCOUNTER — Emergency Department (HOSPITAL_COMMUNITY): Payer: PPO

## 2018-02-03 ENCOUNTER — Emergency Department (HOSPITAL_COMMUNITY)
Admission: EM | Admit: 2018-02-03 | Discharge: 2018-02-03 | Disposition: A | Discharge status: Home / Self Care | Payer: PPO | Attending: Emergency Medicine | Admitting: Emergency Medicine

## 2018-02-03 DIAGNOSIS — Z87891 Personal history of nicotine dependence: Secondary | ICD-10-CM | POA: Insufficient documentation

## 2018-02-03 DIAGNOSIS — J441 Chronic obstructive pulmonary disease with (acute) exacerbation: Secondary | ICD-10-CM | POA: Diagnosis not present

## 2018-02-03 DIAGNOSIS — Z79899 Other long term (current) drug therapy: Secondary | ICD-10-CM | POA: Diagnosis not present

## 2018-02-03 DIAGNOSIS — R05 Cough: Secondary | ICD-10-CM | POA: Diagnosis not present

## 2018-02-03 DIAGNOSIS — R0602 Shortness of breath: Secondary | ICD-10-CM | POA: Diagnosis not present

## 2018-02-03 DIAGNOSIS — J111 Influenza due to unidentified influenza virus with other respiratory manifestations: Secondary | ICD-10-CM | POA: Diagnosis not present

## 2018-02-03 DIAGNOSIS — R6889 Other general symptoms and signs: Secondary | ICD-10-CM

## 2018-02-03 LAB — CBC WITH DIFFERENTIAL/PLATELET
Abs Immature Granulocytes: 0.02 10*3/uL (ref 0.00–0.07)
BASOS PCT: 0 %
Basophils Absolute: 0 10*3/uL (ref 0.0–0.1)
Eosinophils Absolute: 0 10*3/uL (ref 0.0–0.5)
Eosinophils Relative: 0 %
HCT: 40.4 % (ref 39.0–52.0)
Hemoglobin: 12.9 g/dL — ABNORMAL LOW (ref 13.0–17.0)
Immature Granulocytes: 0 %
Lymphocytes Relative: 14 %
Lymphs Abs: 1 10*3/uL (ref 0.7–4.0)
MCH: 28.2 pg (ref 26.0–34.0)
MCHC: 31.9 g/dL (ref 30.0–36.0)
MCV: 88.4 fL (ref 80.0–100.0)
MONO ABS: 0.8 10*3/uL (ref 0.1–1.0)
MONOS PCT: 11 %
Neutro Abs: 5.1 10*3/uL (ref 1.7–7.7)
Neutrophils Relative %: 75 %
PLATELETS: 175 10*3/uL (ref 150–400)
RBC: 4.57 MIL/uL (ref 4.22–5.81)
RDW: 13 % (ref 11.5–15.5)
WBC: 6.9 10*3/uL (ref 4.0–10.5)
nRBC: 0 % (ref 0.0–0.2)

## 2018-02-03 LAB — BASIC METABOLIC PANEL
Anion gap: 7 (ref 5–15)
BUN: 11 mg/dL (ref 8–23)
CALCIUM: 8 mg/dL — AB (ref 8.9–10.3)
CO2: 26 mmol/L (ref 22–32)
Chloride: 103 mmol/L (ref 98–111)
Creatinine, Ser: 1.18 mg/dL (ref 0.61–1.24)
GFR calc Af Amer: >60 mL/min (ref >60)
GLUCOSE: 110 mg/dL — AB (ref 70–99)
Potassium: 3.7 mmol/L (ref 3.5–5.1)
Sodium: 136 mmol/L (ref 135–145)

## 2018-02-03 MED ORDER — IPRATROPIUM-ALBUTEROL 0.5-2.5 (3) MG/3ML IN SOLN
3.0000 mL | Freq: Once | RESPIRATORY_TRACT | Status: AC
  Administered 2018-02-03: 3 mL via RESPIRATORY_TRACT
  Filled 2018-02-03: qty 3

## 2018-02-03 MED ORDER — PREDNISONE 10 MG PO TABS: 40.0000 mg | ORAL_TABLET | Freq: Every day | ORAL | 0 refills | Status: DC

## 2018-02-03 MED ORDER — ALBUTEROL SULFATE (2.5 MG/3ML) 0.083% IN NEBU
5.0000 mg | INHALATION_SOLUTION | Freq: Once | RESPIRATORY_TRACT | Status: AC
  Administered 2018-02-03: 2.5 mg via RESPIRATORY_TRACT
  Filled 2018-02-03: qty 6

## 2018-02-03 MED ORDER — SODIUM CHLORIDE 0.9 % IV SOLN
INTRAVENOUS | Status: DC
  Administered 2018-02-03: 13:00:00 via INTRAVENOUS

## 2018-02-03 MED ORDER — METHYLPREDNISOLONE SODIUM SUCC 125 MG IJ SOLR
125.0000 mg | Freq: Once | INTRAMUSCULAR | Status: AC
  Administered 2018-02-03: 125 mg via INTRAVENOUS
  Filled 2018-02-03: qty 2

## 2018-02-03 NOTE — ED Notes (Signed)
PT ambulated well, O2 fluctuated between 92 and 98.

## 2018-02-03 NOTE — ED Notes (Signed)
Respiratory paged at this time for tx.  

## 2018-02-03 NOTE — ED Triage Notes (Signed)
Pt states that he is having sob with cough, congestion wheezing, fever, and coughing up yellow stuff

## 2018-02-03 NOTE — ED Provider Notes (Signed)
[facility] Provider Note   CSN: [id number] Arrival date & time: 02/03/18  0941     History   Chief Complaint Chief Complaint  Patient presents with  . Cough  . Shortness of Breath    HPI James SheffieldRobert K Johns is a 67 y.o. male.  Patient with a complaint of some shortness of breath that has gotten worse over the past 2 days.  Be started to feel sick on Thursday.  He had a bit of a sore throat had the feeling of fevers and body aches.  The cough is also productive.  Patient has a history of COPD.  Patient also feels that his and he may have the flu.  No nausea vomiting or diarrhea.     Past Medical History:  Diagnosis Date  . Anxiety   . Arthritis    arthritis  . Asthma   . COPD (chronic obstructive pulmonary disease) (HCC)   . Depression   . 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 Hyperlipemia 11/23/2016    Patient Active Problem List   Diagnosis Date Noted  . History of vitamin D deficiency 04/04/2017  . Prediabetes 04/04/2017  . 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 Hyperlipemia 11/23/2016  . Special screening for malignant neoplasms, colon   . Diverticulosis of large intestine without diverticulitis   . 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 History of gout 06/12/2016  . Hard of hearing 06/12/2016  . Asthma in adult, severe persistent, uncomplicated 06/12/2016  . Dysthymia 06/12/2016  . COPD (chronic obstructive pulmonary disease) (HCC)     Past Surgical History:  Procedure Laterality Date  . APPENDECTOMY    . CHOLECYSTECTOMY    . COLONOSCOPY N/A 11/21/2016   Procedure: COLONOSCOPY;  Surgeon: Franky MachoJenkins, Mark, MD;  [facility]: [facility];  Service: Gastroenterology;  Laterality: N/A;  . HAND SURGERY          Home Medications    Prior to Admission medications   Medication Sig Start Date End Date Taking? Authorizing Provider  Aspirin-Salicylamide-Caffeine (BC HEADACHE POWDER PO) Take 1 Package by mouth daily as needed.   Yes [provider]  budesonide-formoterol (SYMBICORT) 160-4.5 MCG/ACT inhaler Inhale 2 puffs into the lungs 2 (two) times daily. Patient taking differently: Inhale 2 puffs into the lungs daily.  06/12/16  Yes Eustace MooreNelson, Yvonne Sue, MD  PARoxetine (PAXIL) 20 MG tablet Take 20 mg by mouth daily.  03/08/15  Yes [provider]  predniSONE (DELTASONE) 5 MG tablet Take 5 mg by mouth daily with breakfast.    Yes [provider]  PROAIR HFA 108 (90 Base) MCG/ACT inhaler Inhale 2 puffs into the lungs every 4 (four) hours as needed for wheezing or shortness of breath.  03/09/15  Yes [provider]  sildenafil (REVATIO) 20 MG tablet Take 20-100 mg by mouth every 30 (thirty) days.   Yes [provider]  HYDROcodone-acetaminophen (NORCO/VICODIN) 5-325 MG tablet Take 1-2 tablets by mouth every 6 (six) hours as needed. Patient not taking: Reported on 02/03/2018 10/08/17   Vanetta MuldersZackowski, Meilani Edmundson, MD  predniSONE (DELTASONE) 10 MG tablet Take 4 tablets (40 mg total) by mouth daily. 02/03/18   Vanetta MuldersZackowski, Rhoda Waldvogel, MD    Family History Family History  Problem Relation Age of Onset  . Diabetes Mother   . Heart disease Mother   . Heart disease Father        old age at 4597  . Heart disease Brother 175       cabg  . Early death  Son 4722       MVA    Social History Social History   Tobacco Use  . Smoking status: Former Smoker    Packs/day: 1.00    Years: 10.00    Pack years: 10.00    Types: Cigarettes    Last attempt to quit: 02/06/1984    Years since quitting: 34.0  . Smokeless tobacco: Former Engineer, waterUser  Substance Use Topics  . Alcohol use: Yes    Comment: occasionally  . Drug use: No     Allergies   Patient has no known allergies.   Review of Systems Review of Systems  Constitutional: Positive for fever.  HENT: Positive for congestion.   Eyes: Negative for redness.  Respiratory: Positive for cough, shortness of breath and wheezing.   Cardiovascular: Negative for chest pain.    Gastrointestinal: Negative for abdominal pain.  Genitourinary: Negative for dysuria.  Musculoskeletal: Positive for myalgias.  Skin: Negative for rash.  Neurological: Negative for headaches.  Hematological: Does not bruise/bleed easily.  Psychiatric/Behavioral: Negative for confusion.     Physical Exam Updated Vital Signs BP (!) 142/70   Pulse 73   Temp 99.6 F (37.6 C) (Oral)   Resp 16   Ht 1.88 m (6\' 2" )   Wt 99.8 kg   SpO2 93%   BMI 28.25 kg/m   Physical Exam Vitals signs and nursing note reviewed.  Constitutional:      General: He is not in acute distress.    Appearance: Normal appearance. He is ill-appearing. He is not toxic-appearing.  HENT:     Head: Normocephalic and atraumatic.     Mouth/Throat:     Mouth: Mucous membranes are dry.  Eyes:     Extraocular Movements: Extraocular movements intact.     Conjunctiva/sclera: Conjunctivae normal.     Pupils: Pupils are equal, round, and reactive to light.  Neck:     Musculoskeletal: Neck supple.  Cardiovascular:     Rate and Rhythm: Normal rate and regular rhythm.  Pulmonary:     Effort: No respiratory distress.     Breath sounds: Rhonchi present. No wheezing or rales.  Abdominal:     General: Bowel sounds are normal.     Tenderness: There is no abdominal tenderness.  Musculoskeletal:        General: No swelling.  Skin:    General: Skin is warm.     Capillary Refill: Capillary refill takes less than 2 seconds.     Findings: No rash.  Neurological:     General: No focal deficit present.     Mental Status: He is alert and oriented to person, place, and time.      ED Treatments / Results  Labs (all labs ordered are listed, but only abnormal results are displayed) Labs Reviewed  CBC WITH DIFFERENTIAL/PLATELET - Abnormal; Notable for the following components:      Result Value   Hemoglobin 12.9 (*)    All other components within normal limits  BASIC METABOLIC PANEL - Abnormal; Notable for the following  components:   Glucose, Bld 110 (*)    Calcium 8.0 (*)    All other components within normal limits    EKG EKG Interpretation  Date/Time:  Sunday February 03 2018 10:39:48 EST Ventricular Rate:  72 PR Interval:    QRS Duration: 98 QT Interval:  362 QTC Calculation: 397 R Axis:   49 Text Interpretation:  Sinus rhythm RSR' in V1 or V2, right VCD or RVH No significant change since  last tracing Reconfirmed by Vanetta Mulders [phone]) on 02/03/2018 12:40:29 PM   Radiology Dg Chest 2 View  Result Date: 02/03/2018 CLINICAL DATA:  Shortness of breath.  Cough.  Wheezing.  Fever. EXAM: CHEST - 2 VIEW COMPARISON:  10/08/2017 FINDINGS: The heart size and mediastinal contours are within normal limits. Both lungs are clear. The visualized skeletal structures are unremarkable. IMPRESSION: No active cardiopulmonary disease. Electronically Signed   By: Francene Boyers M.D.   On: 02/03/2018 12:14    Procedures Procedures (including critical care time)  Medications Ordered in ED Medications  0.9 %  sodium chloride infusion ( Intravenous New Bag/Given 02/03/18 1247)  albuterol (PROVENTIL) (2.5 MG/3ML) 0.083% nebulizer solution 5 mg (2.5 mg Nebulization Given 02/03/18 1050)  ipratropium-albuterol (DUONEB) 0.5-2.5 (3) MG/3ML nebulizer solution 3 mL (3 mLs Nebulization Given 02/03/18 1050)  ipratropium-albuterol (DUONEB) 0.5-2.5 (3) MG/3ML nebulizer solution 3 mL (3 mLs Nebulization Given 02/03/18 1235)  methylPREDNISolone sodium succinate (SOLU-MEDROL) 125 mg/2 mL injection 125 mg (125 mg Intravenous Given 02/03/18 1247)     Initial Impression / Assessment and Plan / ED Course  I have reviewed the triage vital signs and the nursing notes.  Pertinent labs & imaging results that were available during my care of the patient were reviewed by me and considered in my medical decision making (see chart for details).     Patient treated here today with nebulizers x2 with significant improvement in his  breathing.  Chest x-ray negative for pneumonia.  Patient also received 125 mg of Solu-Medrol.  Patient is on a baseline prednisone of 5 mg a day just for arthritis.  Patient is out of the window for Tamiflu based on the onset of his symptoms on Thursday.  Patient ambulated after receiving the 2 nebulizers and his oxygen saturation stayed in the upper 90s.  Patient overall feeling better from a breathing standpoint.  Patient stable for discharge home.  Labs without significant abnormalities.  Do feel that patient is having exacerbation of COPD and has a flulike illness.  Final Clinical Impressions(s) / ED Diagnoses   Final diagnoses:  Flu-like symptoms  COPD exacerbation [facility])    ED Discharge Orders         Ordered    predniSONE (DELTASONE) 10 MG tablet  Daily     02/03/18 1440           Vanetta Mulders, MD 02/03/18 1449

## 2018-02-03 NOTE — Discharge Instructions (Addendum)
Drink plenty of fluids.  Use your albuterol inhaler 2 puffs every 6 hours at least for the next 7 days.  Take the prednisone as directed for the next 5 days.  Return for any new or worse symptoms.  Chest x-ray here today without evidence of pneumonia.  Some of your symptoms may be flulike in nature.

## 2018-04-22 DIAGNOSIS — F419 Anxiety disorder, unspecified: Secondary | ICD-10-CM | POA: Diagnosis not present

## 2018-04-22 DIAGNOSIS — J45909 Unspecified asthma, uncomplicated: Secondary | ICD-10-CM | POA: Diagnosis not present

## 2018-04-22 DIAGNOSIS — M259 Joint disorder, unspecified: Secondary | ICD-10-CM | POA: Diagnosis not present

## 2018-04-22 DIAGNOSIS — J449 Chronic obstructive pulmonary disease, unspecified: Secondary | ICD-10-CM | POA: Diagnosis not present

## 2018-05-06 DIAGNOSIS — Z7952 Long term (current) use of systemic steroids: Secondary | ICD-10-CM | POA: Diagnosis not present

## 2018-05-06 DIAGNOSIS — M353 Polymyalgia rheumatica: Secondary | ICD-10-CM | POA: Diagnosis not present

## 2018-06-27 DIAGNOSIS — M353 Polymyalgia rheumatica: Secondary | ICD-10-CM | POA: Diagnosis not present

## 2018-06-27 DIAGNOSIS — Z7952 Long term (current) use of systemic steroids: Secondary | ICD-10-CM | POA: Diagnosis not present

## 2018-08-13 ENCOUNTER — Other Ambulatory Visit: Payer: PPO

## 2018-08-13 ENCOUNTER — Other Ambulatory Visit: Payer: Self-pay

## 2018-08-13 DIAGNOSIS — R6889 Other general symptoms and signs: Secondary | ICD-10-CM | POA: Diagnosis not present

## 2018-08-13 DIAGNOSIS — Z20822 Contact with and (suspected) exposure to covid-19: Secondary | ICD-10-CM

## 2018-08-16 LAB — NOVEL CORONAVIRUS, NAA: SARS-CoV-2, NAA: NOT DETECTED

## 2018-09-10 DIAGNOSIS — F329 Major depressive disorder, single episode, unspecified: Secondary | ICD-10-CM | POA: Diagnosis not present

## 2018-09-10 DIAGNOSIS — F419 Anxiety disorder, unspecified: Secondary | ICD-10-CM | POA: Diagnosis not present

## 2018-09-10 DIAGNOSIS — J45909 Unspecified asthma, uncomplicated: Secondary | ICD-10-CM | POA: Diagnosis not present

## 2018-09-10 DIAGNOSIS — M259 Joint disorder, unspecified: Secondary | ICD-10-CM | POA: Diagnosis not present

## 2018-09-30 ENCOUNTER — Other Ambulatory Visit: Payer: Self-pay

## 2018-09-30 DIAGNOSIS — Z20822 Contact with and (suspected) exposure to covid-19: Secondary | ICD-10-CM

## 2018-09-30 DIAGNOSIS — M259 Joint disorder, unspecified: Secondary | ICD-10-CM | POA: Diagnosis not present

## 2018-09-30 DIAGNOSIS — J45909 Unspecified asthma, uncomplicated: Secondary | ICD-10-CM | POA: Diagnosis not present

## 2018-09-30 DIAGNOSIS — R6889 Other general symptoms and signs: Secondary | ICD-10-CM | POA: Diagnosis not present

## 2018-09-30 DIAGNOSIS — F329 Major depressive disorder, single episode, unspecified: Secondary | ICD-10-CM | POA: Diagnosis not present

## 2018-09-30 DIAGNOSIS — R442 Other hallucinations: Secondary | ICD-10-CM | POA: Diagnosis not present

## 2018-10-01 LAB — NOVEL CORONAVIRUS, NAA: SARS-CoV-2, NAA: NOT DETECTED

## 2018-10-30 ENCOUNTER — Encounter: Payer: Self-pay | Admitting: Internal Medicine

## 2018-11-18 ENCOUNTER — Telehealth: Payer: Self-pay | Admitting: Internal Medicine

## 2018-11-18 ENCOUNTER — Encounter: Payer: Self-pay | Admitting: Internal Medicine

## 2018-11-18 ENCOUNTER — Ambulatory Visit: Payer: PPO | Admitting: Gastroenterology

## 2018-11-18 NOTE — Telephone Encounter (Signed)
Patient was a no show and letter sent  °

## 2018-12-23 DIAGNOSIS — S8012XA Contusion of left lower leg, initial encounter: Secondary | ICD-10-CM | POA: Diagnosis not present

## 2018-12-23 DIAGNOSIS — M259 Joint disorder, unspecified: Secondary | ICD-10-CM | POA: Diagnosis not present

## 2018-12-23 DIAGNOSIS — J45909 Unspecified asthma, uncomplicated: Secondary | ICD-10-CM | POA: Diagnosis not present

## 2018-12-23 DIAGNOSIS — F419 Anxiety disorder, unspecified: Secondary | ICD-10-CM | POA: Diagnosis not present

## 2018-12-26 DIAGNOSIS — R69 Illness, unspecified: Secondary | ICD-10-CM | POA: Diagnosis not present

## 2019-04-07 DIAGNOSIS — F329 Major depressive disorder, single episode, unspecified: Secondary | ICD-10-CM | POA: Diagnosis not present

## 2019-04-07 DIAGNOSIS — M259 Joint disorder, unspecified: Secondary | ICD-10-CM | POA: Diagnosis not present

## 2019-04-07 DIAGNOSIS — F419 Anxiety disorder, unspecified: Secondary | ICD-10-CM | POA: Diagnosis not present

## 2019-04-07 DIAGNOSIS — J45909 Unspecified asthma, uncomplicated: Secondary | ICD-10-CM | POA: Diagnosis not present

## 2019-09-25 DIAGNOSIS — J45909 Unspecified asthma, uncomplicated: Secondary | ICD-10-CM | POA: Diagnosis not present

## 2019-09-25 DIAGNOSIS — M259 Joint disorder, unspecified: Secondary | ICD-10-CM | POA: Diagnosis not present

## 2019-09-25 DIAGNOSIS — N5201 Erectile dysfunction due to arterial insufficiency: Secondary | ICD-10-CM | POA: Diagnosis not present

## 2019-09-25 DIAGNOSIS — F419 Anxiety disorder, unspecified: Secondary | ICD-10-CM | POA: Diagnosis not present

## 2019-09-25 DIAGNOSIS — E7800 Pure hypercholesterolemia, unspecified: Secondary | ICD-10-CM | POA: Diagnosis not present

## 2020-01-10 ENCOUNTER — Emergency Department (HOSPITAL_COMMUNITY): Payer: PPO

## 2020-01-10 ENCOUNTER — Other Ambulatory Visit: Payer: Self-pay

## 2020-01-10 ENCOUNTER — Encounter (HOSPITAL_COMMUNITY): Payer: Self-pay | Admitting: Emergency Medicine

## 2020-01-10 ENCOUNTER — Emergency Department (HOSPITAL_COMMUNITY)
Admission: EM | Admit: 2020-01-10 | Discharge: 2020-01-10 | Disposition: A | Discharge status: Home / Self Care | Payer: PPO | Attending: Emergency Medicine | Admitting: Emergency Medicine

## 2020-01-10 DIAGNOSIS — M7989 Other specified soft tissue disorders: Secondary | ICD-10-CM | POA: Diagnosis not present

## 2020-01-10 DIAGNOSIS — Z4800 Encounter for change or removal of nonsurgical wound dressing: Secondary | ICD-10-CM | POA: Diagnosis not present

## 2020-01-10 DIAGNOSIS — J449 Chronic obstructive pulmonary disease, unspecified: Secondary | ICD-10-CM | POA: Diagnosis not present

## 2020-01-10 DIAGNOSIS — S91332A Puncture wound without foreign body, left foot, initial encounter: Secondary | ICD-10-CM | POA: Insufficient documentation

## 2020-01-10 DIAGNOSIS — M7732 Calcaneal spur, left foot: Secondary | ICD-10-CM | POA: Diagnosis not present

## 2020-01-10 DIAGNOSIS — W450XXA Nail entering through skin, initial encounter: Secondary | ICD-10-CM | POA: Diagnosis not present

## 2020-01-10 DIAGNOSIS — Y990 Civilian activity done for income or pay: Secondary | ICD-10-CM | POA: Diagnosis not present

## 2020-01-10 DIAGNOSIS — L089 Local infection of the skin and subcutaneous tissue, unspecified: Secondary | ICD-10-CM

## 2020-01-10 DIAGNOSIS — Z87891 Personal history of nicotine dependence: Secondary | ICD-10-CM | POA: Insufficient documentation

## 2020-01-10 DIAGNOSIS — J4550 Severe persistent asthma, uncomplicated: Secondary | ICD-10-CM | POA: Diagnosis not present

## 2020-01-10 DIAGNOSIS — S99922A Unspecified injury of left foot, initial encounter: Secondary | ICD-10-CM | POA: Diagnosis present

## 2020-01-10 MED ORDER — CIPROFLOXACIN HCL 500 MG PO TABS: 500.0000 mg | ORAL_TABLET | Freq: Two times a day (BID) | ORAL | 0 refills | Status: DC

## 2020-01-10 MED ORDER — CIPROFLOXACIN HCL 250 MG PO TABS
500.0000 mg | ORAL_TABLET | Freq: Once | ORAL | Status: AC
  Administered 2020-01-10: 500 mg via ORAL
  Filled 2020-01-10: qty 2

## 2020-01-10 MED ORDER — DOXYCYCLINE HYCLATE 100 MG PO TABS
100.0000 mg | ORAL_TABLET | Freq: Once | ORAL | Status: AC
  Administered 2020-01-10: 100 mg via ORAL
  Filled 2020-01-10: qty 1

## 2020-01-10 MED ORDER — DOXYCYCLINE HYCLATE 100 MG PO CAPS: 100.0000 mg | ORAL_CAPSULE | Freq: Two times a day (BID) | ORAL | 0 refills | Status: DC

## 2020-01-10 NOTE — ED Notes (Signed)
Stepped on a nail 4 days ago   Seen in [place] yesterday   Given antibiotics   Here for swelling and pain of foot

## 2020-01-10 NOTE — ED Provider Notes (Signed)
[facility] EMERGENCY DEPARTMENT Provider Note   CSN: [id number] Arrival date & time: 01/10/20  1617     History Chief Complaint  Patient presents with  . Wound Check    [name] is a 69 y.o. male.  [name] is a 69 y.o. male with a history of COPD, asthma, hyperlipidemia, arthritis and depression, who presents to the emergency department for evaluation of injury to his left foot.  He states that about 4 days ago he [place] when he stepped on a nail, he was wearing tennis shoes with rubber soles at the time, the nail was going through a board and he had to pull the nail out of his foot.  He states that the nail almost came completely through the top of the foot.  He has been soaking it and trying to keep it clean but yesterday noticed worsening swelling and redness of the foot, he called his primary care doctor and they prescribed Keflex which he is taken 2 doses of.  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  Reports tetanus vaccine was updated 3 days ago.  Denies numbness or tingling but is concerned about the worsening swelling.  Has not noted any purulent drainage from the foot.        Past Medical History:  Diagnosis Date  . Anxiety   . Arthritis    arthritis  . Asthma   . COPD (chronic obstructive pulmonary disease) (HCC)   . Depression   . 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 Hyperlipemia 11/23/2016    Patient Active Problem List   Diagnosis Date Noted  . History of vitamin D deficiency 04/04/2017  . Prediabetes 04/04/2017  . 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 Hyperlipemia 11/23/2016  . Special screening for malignant neoplasms, colon   . Diverticulosis of large intestine without diverticulitis   . 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 History of gout 06/12/2016  . Hard of hearing 06/12/2016  . Asthma in adult, severe persistent,  uncomplicated 06/12/2016  . Dysthymia 06/12/2016  . COPD (chronic obstructive pulmonary disease) (HCC)     Past Surgical History:  Procedure Laterality Date  . APPENDECTOMY    . CHOLECYSTECTOMY    . COLONOSCOPY N/A 11/21/2016   Procedure: COLONOSCOPY;  Surgeon: Franky Macho, MD;  Location: [facility];  Service: Gastroenterology;  Laterality: N/A;  . HAND SURGERY         Family History  Problem Relation Age of Onset  . Diabetes Mother   . Heart disease Mother   . Heart disease Father        old age at 67  . Heart disease Brother 48       cabg  . Early death Son 18       MVA    Social History   Tobacco Use  . Smoking status: Former Smoker    Packs/day: 1.00    Years: 10.00    Pack years: 10.00    Types: Cigarettes    Quit date: 02/06/1984    Years since quitting: 35.9  . Smokeless tobacco: Former Clinical biochemist  . Vaping Use: Never used  Substance Use Topics  . Alcohol use: Yes    Comment: occasionally  . Drug use: No    Home Medications Prior to Admission medications   Medication Sig Start Date End Date Taking? Authorizing  Provider  Aspirin-Salicylamide-Caffeine (BC HEADACHE POWDER PO) Take 1 Package by mouth daily as needed.    [provider]  budesonide-formoterol (SYMBICORT) 160-4.5 MCG/ACT inhaler Inhale 2 puffs into the lungs 2 (two) times daily. Patient taking differently: Inhale 2 puffs into the lungs daily.  06/12/16   Eustace Moore, MD  ciprofloxacin (CIPRO) 500 MG tablet Take 1 tablet (500 mg total) by mouth 2 (two) times daily. One po bid x 7 days 01/10/20   Dartha Lodge, PA-C  doxycycline (VIBRAMYCIN) 100 MG capsule Take 1 capsule (100 mg total) by mouth 2 (two) times daily. One po bid x 7 days 01/10/20   Dartha Lodge, PA-C  HYDROcodone-acetaminophen (NORCO/VICODIN) 5-325 MG tablet Take 1-2 tablets by mouth every 6 (six) hours as needed. Patient not taking: Reported on 02/03/2018 10/08/17   Vanetta Mulders, MD  PARoxetine (PAXIL)  20 MG tablet Take 20 mg by mouth daily.  03/08/15   [provider]  predniSONE (DELTASONE) 10 MG tablet Take 4 tablets (40 mg total) by mouth daily. 02/03/18   Vanetta Mulders, MD  predniSONE (DELTASONE) 5 MG tablet Take 5 mg by mouth daily with breakfast.     [provider]  PROAIR HFA 108 (90 Base) MCG/ACT inhaler Inhale 2 puffs into the lungs every 4 (four) hours as needed for wheezing or shortness of breath.  03/09/15   [provider]  sildenafil (REVATIO) 20 MG tablet Take 20-100 mg by mouth every 30 (thirty) days.    [provider]    Allergies    Patient has no known allergies.  Review of Systems   Review of Systems  Constitutional: Negative for chills and fever.  Cardiovascular: Negative for leg swelling.  Gastrointestinal: Negative for nausea and vomiting.  Musculoskeletal: Negative for arthralgias and joint swelling.  Skin: Positive for color change and wound.  Neurological: Negative for weakness and numbness.    Physical Exam Updated Vital Signs BP (!) 159/56 (BP Location: Right Arm)   Pulse 79   Temp 98.2 F (36.8 C) (Oral)   Resp 18   Ht 6\' 2"  (1.88 m)   Wt 104.3 kg   SpO2 97%   BMI 29.53 kg/m   Physical Exam Vitals and nursing note reviewed.  Constitutional:      General: He is not in acute distress.    Appearance: Normal appearance. He is well-developed. He is not diaphoretic.     Comments: Elderly gentleman, well-appearing and in no acute distress  HENT:     Head: Normocephalic and atraumatic.  Eyes:     General:        Right eye: No discharge.        Left eye: No discharge.  Pulmonary:     Effort: Pulmonary effort is normal. No respiratory distress.  Musculoskeletal:     Comments: The left foot is erythematous and edematous with puncture wound noted to the bottom of the foot and some petechiae noted to the top of the foot, no induration, fluctuance or purulent drainage around puncture wound, no lymphangitic  streaking, redness and swelling does not extend up the leg, DP pulses 2+, normal sensation and strength (see photos below)  Skin:    General: Skin is warm and dry.     Findings: Erythema present.  Neurological:     Mental Status: He is alert and oriented to person, place, and time.     Coordination: Coordination normal.  Psychiatric:        Mood and  Affect: Mood normal.        Behavior: Behavior normal.           ED Results / Procedures / Treatments   Labs (all labs ordered are listed, but only abnormal results are displayed) Labs Reviewed - No data to display  EKG None  Radiology DG Foot Complete Left  Result Date: 01/10/2020 CLINICAL DATA:  Stepped on a nail 4 days ago. Redness swelling and pain. EXAM: LEFT FOOT - COMPLETE 3+ VIEW COMPARISON:  None. FINDINGS: No fracture or bone lesion. No areas of bone resorption to suggest osteomyelitis. Significant joint space narrowing with subchondral sclerosis and marginal osteophytes at the first metatarsophalangeal joint. Small plantar calcaneal spur. Subtle mid to forefoot plantar soft tissue edema versus hemorrhage. No radiopaque foreign body. IMPRESSION: 1. No fracture or radiopaque foreign body. Electronically Signed   By: Amie Portland M.D.   On: 01/10/2020 17:38    Procedures Procedures (including critical care time)  Medications Ordered in ED Medications  ciprofloxacin (CIPRO) tablet 500 mg (500 mg Oral Given 01/10/20 1939)  doxycycline (VIBRA-TABS) tablet 100 mg (100 mg Oral Given 01/10/20 1939)    ED Course  I have reviewed the triage vital signs and the nursing notes.  Pertinent labs & imaging results that were available during my care of the patient were reviewed by me and considered in my medical decision making (see chart for details).    MDM Rules/Calculators/A&P                         69 year old male presents 4 days after a puncture wound to the bottom of the left foot, after stepping on a nail that went through  the sole of his rubber tennis shoes.  It has become increasingly red and swollen in particular over the past 2 days, no fever and patient is well-appearing.  No purulent drainage, fluctuance or induration noted on exam.  No lymphangitic streaking.  X-rays with no bony abnormalities or foreign body.  Patient started on Keflex by primary care provider today and has taken 2 doses, but feel that patient will need Pseudomonas and MRSA coverage.  Will discontinue Keflex and start patient on Cipro and doxycycline.  Areas of erythema have been demarcated and I have provided patient with strict return precautions.  Regardless of progress he should return in 2 days for wound check or see his PCP in 2 days, but if he has any worsening of redness beyond lines, fevers or other new or concerning symptoms he should return for sooner reevaluation.  He expresses understanding and agreement.  Discharged home in good condition.  Patient discussed with Dr. Rhunette Croft, who saw patient as well and agrees with plan.   Final Clinical Impression(s) / ED Diagnoses Final diagnoses:  Puncture wound of plantar aspect of left foot with infection, initial encounter    Rx / DC Orders ED Discharge Orders         Ordered    doxycycline (VIBRAMYCIN) 100 MG capsule  2 times daily        01/10/20 1929    ciprofloxacin (CIPRO) 500 MG tablet  2 times daily        01/10/20 1929           Legrand Rams 01/10/20 2013    Derwood Kaplan, MD 01/10/20 [phone]

## 2020-01-10 NOTE — ED Notes (Signed)
Puncture wound to bottom as well as top of foot   Distal half of foot is swollen and reddened   Pt reports he has been soaking his foot

## 2020-01-10 NOTE — ED Notes (Signed)
KF, PA in to reassess

## 2020-01-10 NOTE — ED Triage Notes (Signed)
Pt is here for a wound check on his left foot. Left foot is swollen. Pt stepped on a nail 4 days ago.

## 2020-01-10 NOTE — ED Notes (Signed)
KF, PA in to assess 

## 2020-01-10 NOTE — Discharge Instructions (Signed)
Stop taking Keflex, take Cipro and doxycycline as prescribed for the next 7 days.  You should monitor your foot closely if swelling or redness extends beyond the lines we marked on your foot today you should return immediately, you should also return if you develop fevers or significantly worsened pain.  Otherwise I would like for you to follow-up either here in the emergency department or with your primary care provider in 2 days for a wound check to ensure things are improving appropriately.

## 2020-01-10 NOTE — ED Notes (Signed)
Awaiting eval by Dr Dorris Carnes

## 2020-03-18 ENCOUNTER — Ambulatory Visit: Payer: PPO | Admitting: Internal Medicine

## 2020-03-29 ENCOUNTER — Other Ambulatory Visit: Payer: Self-pay

## 2020-03-29 ENCOUNTER — Encounter: Payer: Self-pay | Admitting: Internal Medicine

## 2020-03-29 ENCOUNTER — Ambulatory Visit (INDEPENDENT_AMBULATORY_CARE_PROVIDER_SITE_OTHER): Payer: PPO | Admitting: Internal Medicine

## 2020-03-29 VITALS — BP 102/70 | HR 83 | Temp 98.7°F | Resp 18 | Ht 74.0 in | Wt 230.4 lb

## 2020-03-29 DIAGNOSIS — M353 Polymyalgia rheumatica: Secondary | ICD-10-CM

## 2020-03-29 DIAGNOSIS — J4550 Severe persistent asthma, uncomplicated: Secondary | ICD-10-CM

## 2020-03-29 DIAGNOSIS — F339 Major depressive disorder, recurrent, unspecified: Secondary | ICD-10-CM

## 2020-03-29 DIAGNOSIS — B86 Scabies: Secondary | ICD-10-CM | POA: Insufficient documentation

## 2020-03-29 DIAGNOSIS — Z7689 Persons encountering health services in other specified circumstances: Secondary | ICD-10-CM | POA: Diagnosis not present

## 2020-03-29 MED ORDER — PREDNISONE 2.5 MG PO TABS: 2.5000 mg | ORAL_TABLET | Freq: Every day | ORAL | 0 refills | Status: DC

## 2020-03-29 MED ORDER — PERMETHRIN 5 % EX CREA: 1.0000 "application " | TOPICAL_CREAM | Freq: Once | CUTANEOUS | 0 refills | Status: DC

## 2020-03-29 NOTE — Assessment & Plan Note (Deleted)
Well-controlled On Symbicort and PRN Albuterol 

## 2020-03-29 NOTE — Assessment & Plan Note (Signed)
On Paxil 

## 2020-03-29 NOTE — Assessment & Plan Note (Signed)
Care established Previous chart reviewed History and medications reviewed with the patient 

## 2020-03-29 NOTE — Assessment & Plan Note (Signed)
Prescribed Permethrin cream

## 2020-03-29 NOTE — Progress Notes (Signed)
New Patient Office Visit  Subjective:  Patient ID: [name], male    DOB: [birth year]  Age: 70 y.o. MRN: [id number]  CC:  Chief Complaint  Patient presents with  . New Patient (Initial Visit)    New patient former [facility] pt has a rash on both arms that has been there for about a month scab looking itches has tried all the otc medication and it is not helping    HPI [name] is a 70 year old male with PMH of asthma, depression, gout, PMR and erectile dysfunction who presents for establishing care.  He has a h/o PMR, and has recurrent episodes of left shoulder, right knee and left foot pain, for which he was placed on steroids by his previous Rheumatologist. He was taking Prednisone 2.5 mg QD, but he ran out of it about 2 months ago. He also h/o gout, but has not had any flare recently.  He c/o rash over his b/l UE, which small, red spots and it is itching excessively.  Last colonoscopy in 2018.  He has had 2 doses of COVID vaccine.  Past Medical History:  Diagnosis Date  . Anxiety   . Arthritis    arthritis  . Asthma   . COPD (chronic obstructive pulmonary disease) ([place])   . Depression   . 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 Hyperlipemia 11/23/2016    Past Surgical History:  Procedure Laterality Date  . APPENDECTOMY    . CHOLECYSTECTOMY    . COLONOSCOPY N/A 11/21/2016   Procedure: COLONOSCOPY;  Surgeon: Aviva Signs, MD;  [facility];  Service: Gastroenterology;  Laterality: N/A;  . HAND SURGERY      Family History  Problem Relation Age of Onset  . Diabetes Mother   . Heart disease Mother   . Heart disease Father        old age at 88  . Heart disease Brother 71       cabg  . Early death Son 7       MVA    Social History   Socioeconomic History  . Marital status: Widowed    Spouse name: Secondary school teacher  . Number of children: 2  . Years of education: 20  . Highest education level: Not on file  Occupational History  .  Occupation: retired    Comment: CFO of [employer]  Tobacco Use  . Smoking status: Former Smoker    Packs/day: 1.00    Years: 10.00    Pack years: 10.00    Types: Cigarettes    Quit date: 02/06/1984    Years since quitting: 36.1  . Smokeless tobacco: Former Network engineer  . Vaping Use: Never used  Substance and Sexual Activity  . Alcohol use: Yes    Comment: occasionally  . Drug use: No  . Sexual activity: Not Currently  Other Topics Concern  . 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  HENT: Negative for congestion and sore throat.   Eyes: Negative for pain and discharge.  Respiratory: Negative for cough and shortness of breath.   Cardiovascular: Negative for chest pain and palpitations.  Gastrointestinal: Negative for constipation, diarrhea, nausea and vomiting.  Endocrine: Negative for polydipsia and polyuria.  Genitourinary: Negative for dysuria and hematuria.  Musculoskeletal: Positive for arthralgias. Negative for neck pain and neck stiffness.  Skin: Positive for rash.  Neurological: Negative for dizziness, weakness, numbness and headaches.  Psychiatric/Behavioral: Negative for agitation and behavioral problems.    Objective:   Today's Vitals: BP 102/70 (BP Location: Right Arm, Patient Position: Sitting, Cuff Size: Normal)   Pulse 83   Temp 98.7 F (37.1 C) (Oral)   Resp 18   Ht '6\' 2"'  (1.88 m)   Wt 230 lb 6.4 oz (104.5 kg)   SpO2 96%   BMI 29.58 kg/m    Physical Exam Vitals reviewed.  Constitutional:      General: He is not in acute distress.    Appearance: He is not diaphoretic.  HENT:     Head: Normocephalic and atraumatic.     Nose: Nose normal.     Mouth/Throat:     Mouth: Mucous membranes are moist.  Eyes:     General: No scleral icterus.    Extraocular Movements: Extraocular movements intact.     Pupils: Pupils are equal, round, and reactive to light.  Cardiovascular:     Rate and Rhythm: Normal rate and regular rhythm.     Pulses: Normal pulses.     Heart sounds: Normal heart sounds. No murmur heard.   Pulmonary:     Breath sounds: Normal breath sounds. No wheezing or rales.  Abdominal:     Palpations: Abdomen is soft.     Tenderness: There is no abdominal tenderness.  Musculoskeletal:     Cervical back: Neck supple. No tenderness.     Right lower leg: No edema.     Left lower leg: No edema.  Skin:    General: Skin is warm.     Findings: Rash (erythematous papules and burrows over b/l forearms) present.  Neurological:     General: No focal deficit present.     Mental Status: He is alert and oriented to person, place, and time.  Psychiatric:        Mood and Affect: Mood normal.        Behavior: Behavior normal.     Assessment & Plan:   Problem List Items Addressed This Visit      Encounter to establish care - Primary   Care established Previous chart reviewed History and medications reviewed with the patient     Relevant Orders  CBC  CMP14+EGFR  Hemoglobin A1c  Lipid panel  TSH  Vitamin D (25 hydroxy)    Respiratory   Asthma in adult, severe persistent, uncomplicated    Well-controlled On Symbicort and PRN Albuterol      Relevant Medications   predniSONE (DELTASONE) 2.5 MG tablet     Musculoskeletal and Integument   Scabies    Prescribed Permethrin cream      Relevant Medications   permethrin (ELIMITE) 5 % cream     Other   Depression, recurrent (HCC)    On Paxil       Polymyalgia rheumatica syndrome (HCC)    Was on steroids, ran out of Prednisone Followed up with Rheumatology, requests a new referral as previous Rheumatologist left  Relevant Medications   predniSONE (DELTASONE) 2.5 MG tablet   Other Relevant Orders   Ambulatory referral to Rheumatology   C-reactive protein    Outpatient Encounter Medications as of 03/29/2020  Medication Sig  . Aspirin-Salicylamide-Caffeine (BC HEADACHE POWDER PO) Take 1 Package by mouth daily as needed.  . budesonide-formoterol (SYMBICORT) 160-4.5 MCG/ACT inhaler Inhale 2 puffs into the lungs 2 (two) times daily. (Patient taking differently: Inhale 2 puffs into the lungs daily.)  . PARoxetine (PAXIL) 20 MG tablet Take 20 mg by mouth daily.   . permethrin (ELIMITE) 5 % cream Apply 1 application topically once for 1 dose.  . predniSONE (DELTASONE) 2.5 MG tablet Take 1 tablet (2.5 mg total) by mouth daily with breakfast.  . PROAIR HFA 108 (90 Base) MCG/ACT inhaler Inhale 2 puffs into the lungs every 4 (four) hours as needed for wheezing or shortness of breath.   . sildenafil (REVATIO) 20 MG tablet Take 20-100 mg by mouth every 30 (thirty) days.  . [DISCONTINUED] ciprofloxacin (CIPRO) 500 MG tablet Take 1 tablet (500 mg total) by mouth 2 (two) times daily. One po bid x 7 days (Patient not taking: Reported on 03/29/2020)  . [DISCONTINUED] doxycycline (VIBRAMYCIN) 100 MG capsule Take 1 capsule (100 mg total) by mouth 2 (two) times daily. One po bid x 7 days (Patient not taking: Reported on 03/29/2020)  . [DISCONTINUED] HYDROcodone-acetaminophen (NORCO/VICODIN) 5-325 MG tablet Take 1-2 tablets by mouth every 6 (six) hours as needed. (Patient not taking: No sig reported)  . [DISCONTINUED] predniSONE (DELTASONE) 10 MG tablet Take 4 tablets (40 mg total) by mouth daily. (Patient not taking: Reported on 03/29/2020)  . [DISCONTINUED] predniSONE (DELTASONE) 5 MG tablet Take 5 mg by mouth daily with breakfast.  (Patient not taking:  Reported on 03/29/2020)   No facility-administered encounter medications on file as of 03/29/2020.    Follow-up: Return in about 3 months (around 06/26/2020) for PMR.   Lindell Spar, MD

## 2020-03-29 NOTE — Assessment & Plan Note (Signed)
Well-controlled On Symbicort and PRN Albuterol 

## 2020-03-29 NOTE — Assessment & Plan Note (Signed)
Was on steroids, ran out of Prednisone Followed up with Rheumatology, requests a new referral as previous Rheumatologist left

## 2020-03-29 NOTE — Patient Instructions (Signed)
Please massage permethrin cream thoroughly into the skin from the neck to the soles of the feet, including areas under the fingernails and toenails. Thirty grams is usually sufficient for a single application for an average adult.  Permethrin should be removed by washing (shower or bath) after 8 to 14 hours. Treatment is often performed overnight.  Please continue to take other medications as prescribed.  Please follow up with Rheumatologist as scheduled.  Please get fasting blood tests done within a week.

## 2020-04-29 ENCOUNTER — Other Ambulatory Visit: Payer: Self-pay | Admitting: Internal Medicine

## 2020-04-29 DIAGNOSIS — B86 Scabies: Secondary | ICD-10-CM

## 2020-04-29 DIAGNOSIS — M353 Polymyalgia rheumatica: Secondary | ICD-10-CM

## 2020-06-08 ENCOUNTER — Other Ambulatory Visit: Payer: Self-pay | Admitting: Internal Medicine

## 2020-06-08 DIAGNOSIS — B86 Scabies: Secondary | ICD-10-CM

## 2020-06-15 ENCOUNTER — Other Ambulatory Visit: Payer: Self-pay

## 2020-06-15 ENCOUNTER — Ambulatory Visit (INDEPENDENT_AMBULATORY_CARE_PROVIDER_SITE_OTHER): Payer: PPO | Admitting: Nurse Practitioner

## 2020-06-15 ENCOUNTER — Encounter: Payer: Self-pay | Admitting: Nurse Practitioner

## 2020-06-15 VITALS — BP 126/78 | HR 70 | Temp 98.6°F | Resp 20 | Ht 74.0 in | Wt 230.0 lb

## 2020-06-15 DIAGNOSIS — R21 Rash and other nonspecific skin eruption: Secondary | ICD-10-CM

## 2020-06-15 DIAGNOSIS — Z0000 Encounter for general adult medical examination without abnormal findings: Secondary | ICD-10-CM | POA: Diagnosis not present

## 2020-06-15 MED ORDER — TRIAMCINOLONE ACETONIDE 0.1 % EX CREA: TOPICAL_CREAM | Freq: Every day | CUTANEOUS | 1 refills | Status: AC

## 2020-06-15 NOTE — Progress Notes (Signed)
Acute Office Visit  Subjective:    Patient ID: [name], male    DOB: February 05, 1951, 70 y.o.   MRN: [id number]  Chief Complaint  Patient presents with  . Rash    X 4 months     HPI Patient is in today for rash that has been ongoing for 4 months. He tried permethrin cream, but that didn't help. He states it flares, and it comes and goes. He has tried tea oil and OTC psoriasis and eczema cream.  He tried iodine, and that didn't help   He states calamine lotion helps.  The rash it on his abdomen, outer arms, lateral aspect of legs. He states that it itches.  Past Medical History:  Diagnosis Date  . Anxiety   . Arthritis    arthritis  . Asthma   . COPD (chronic obstructive pulmonary disease) ([place])   . Depression   . 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 Hyperlipemia 11/23/2016    Past Surgical History:  Procedure Laterality Date  . APPENDECTOMY    . CHOLECYSTECTOMY    . COLONOSCOPY N/A 11/21/2016   Procedure: COLONOSCOPY;  Surgeon: Aviva Signs, MD;  Location[facility];  Service: Gastroenterology;  Laterality: N/A;  . HAND SURGERY      Family History  Problem Relation Age of Onset  . Diabetes Mother   . Heart disease Mother   . Heart disease Father        old age at 67  . Heart disease Brother 39       cabg  . Early death Son 62       MVA    Social History   Socioeconomic History  . Marital status: Widowed    Spouse name: Secondary school teacher  . Number of children: 2  . Years of education: 82  . Highest education level: Not on file  Occupational History  . Occupation: retired    Comment: CFO of [employer]  Tobacco Use  . Smoking status: Former Smoker    Packs/day: 1.00    Years: 10.00    Pack years: 10.00    Types: Cigarettes    Quit date: 02/06/1984    Years since quitting: 36.3  . Smokeless tobacco: Former Network engineer  . Vaping Use: Never used  Substance and Sexual Activity  . Alcohol use: Yes    Comment: occasionally  . Drug use: No  .  Sexual activity: Not Currently  Other Topics Concern  . 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 Aspirin-Salicylamide-Caffeine (BC HEADACHE POWDER PO) Take 1 Package by mouth daily as needed.    . budesonide-formoterol (SYMBICORT) 160-4.5 MCG/ACT inhaler Inhale 2 puffs into the lungs 2 (two) times daily. (Patient taking differently: Inhale 2 puffs into the lungs daily.) 3 Inhaler 3  . PARoxetine (PAXIL) 20 MG tablet Take 20 mg by mouth daily.   0  . PROAIR  HFA 108 (90 Base) MCG/ACT inhaler Inhale 2 puffs into the lungs every 4 (four) hours as needed for wheezing or shortness of breath.   2  . sildenafil (REVATIO) 20 MG tablet Take 20-100 mg by mouth every 30 (thirty) days.    . permethrin (ELIMITE) 5 % cream APPLY TOPICALLY TO THE AFFECTED AREA 1 TIME FOR 1 DOSE 60 g 0  . predniSONE (DELTASONE) 2.5 MG tablet TAKE 1 TABLET(2.5 MG) BY MOUTH DAILY WITH BREAKFAST 30 tablet 0   No facility-administered medications prior to visit.    No Known Allergies  Review of Systems  Constitutional: Negative.   Skin: Positive for rash.       pruritic       Objective:    Physical Exam Constitutional:      Appearance: Normal appearance.  Skin:    Findings: Rash present.     Comments: Scabs to lateral aspect of arms and legs as well as abdomen; no satellite lesions  Neurological:     Mental Status: He is alert.     BP 126/78   Pulse 70   Temp 98.6 F (37 C)   Resp 20   Ht '6\' 2"'  (1.88 m)    Wt 230 lb (104.3 kg)   SpO2 96%   BMI 29.53 kg/m  Wt Readings from Last 3 Encounters:  06/15/20 230 lb (104.3 kg)  03/29/20 230 lb 6.4 oz (104.5 kg)  01/10/20 230 lb (104.3 kg)    Health Maintenance Due  Topic Date Due  . COVID-19 Vaccine (1) Never done  . PNA vac Low Risk Adult (1 of 2 - PCV13) Never done    There are no preventive care reminders to display for this patient.   Lab Results  Component Value Date   TSH 2.55 07/05/2016   Lab Results  Component Value Date   WBC 6.9 02/03/2018   HGB 12.9 (L) 02/03/2018   HCT 40.4 02/03/2018   MCV 88.4 02/03/2018   PLT 175 02/03/2018   Lab Results  Component Value Date   NA 136 02/03/2018   K 3.7 02/03/2018   CO2 26 02/03/2018   GLUCOSE 110 (H) 02/03/2018   BUN 11 02/03/2018   CREATININE 1.18 02/03/2018   BILITOT 0.7 03/04/2016   ALKPHOS 91 03/04/2016   AST 23 03/04/2016   ALT 19 03/04/2016   PROT 6.8 03/04/2016   ALBUMIN 3.6 03/04/2016   CALCIUM 8.0 (L) 02/03/2018   ANIONGAP 7 02/03/2018   No results found for: CHOL No results found for: HDL No results found for: LDLCALC No results found for: TRIG No results found for: CHOLHDL No results found for: HGBA1C     Assessment & Plan:   Problem List Items Addressed This Visit      Musculoskeletal and Integument   Rash    -he tried permethrin cream for scabies, but he states this didn't help -he has tried multiple OTC creams for eczema and psoriasis, but that isn't helping -he tried iodine and calamine lotion, and the calamine lotion helps some -Rx. Ketoconazole-triamcinolone cream -unsure of etiology of his skin issue; could be bug bites since scabs are rather large -referral to derm for definitive treatment      Relevant Orders   Ambulatory referral to Dermatology    Other Visit Diagnoses    Annual physical exam    -  Primary   Relevant Orders   CBC with Differential/Platelet   CMP14+EGFR   Lipid Panel With LDL/HDL Ratio  Meds ordered  this encounter  Medications  . ketoconazole 2%-triamcinolone 0.1% 1:2 cream mixture    Sig: Apply topically daily.    Dispense:  45 g    Refill:  [place], NP

## 2020-06-15 NOTE — Patient Instructions (Signed)
Please have fasting labs drawn 2-3 days prior to your appointment so we can discuss the results during your office visit.  

## 2020-06-15 NOTE — Assessment & Plan Note (Signed)
-  he tried permethrin cream for scabies, but he states this didn't help -he has tried multiple OTC creams for eczema and psoriasis, but that isn't helping -he tried iodine and calamine lotion, and the calamine lotion helps some -Rx. Ketoconazole-triamcinolone cream -unsure of etiology of his skin issue; could be bug bites since scabs are rather large -referral to derm for definitive treatment

## 2020-06-25 ENCOUNTER — Other Ambulatory Visit: Payer: Self-pay | Admitting: Internal Medicine

## 2020-06-25 DIAGNOSIS — M353 Polymyalgia rheumatica: Secondary | ICD-10-CM

## 2020-06-28 ENCOUNTER — Ambulatory Visit: Payer: PPO | Admitting: Internal Medicine

## 2020-06-30 ENCOUNTER — Other Ambulatory Visit: Payer: Self-pay

## 2020-06-30 ENCOUNTER — Encounter: Payer: Self-pay | Admitting: Internal Medicine

## 2020-06-30 ENCOUNTER — Ambulatory Visit (INDEPENDENT_AMBULATORY_CARE_PROVIDER_SITE_OTHER): Payer: PPO | Admitting: Internal Medicine

## 2020-06-30 VITALS — BP 161/74 | HR 67 | Resp 18 | Ht 74.0 in | Wt 235.4 lb

## 2020-06-30 DIAGNOSIS — J4550 Severe persistent asthma, uncomplicated: Secondary | ICD-10-CM | POA: Diagnosis not present

## 2020-06-30 DIAGNOSIS — M353 Polymyalgia rheumatica: Secondary | ICD-10-CM

## 2020-06-30 DIAGNOSIS — Z0000 Encounter for general adult medical examination without abnormal findings: Secondary | ICD-10-CM | POA: Diagnosis not present

## 2020-06-30 DIAGNOSIS — N529 Male erectile dysfunction, unspecified: Secondary | ICD-10-CM

## 2020-06-30 DIAGNOSIS — B86 Scabies: Secondary | ICD-10-CM | POA: Diagnosis not present

## 2020-06-30 MED ORDER — SILDENAFIL CITRATE 20 MG PO TABS: 20.0000 mg | ORAL_TABLET | Freq: Every day | ORAL | 1 refills | Status: DC | PRN

## 2020-06-30 MED ORDER — PROAIR HFA 108 (90 BASE) MCG/ACT IN AERS: 2.0000 | INHALATION_SPRAY | RESPIRATORY_TRACT | 2 refills | Status: DC | PRN

## 2020-06-30 MED ORDER — IVERMECTIN 3 MG PO TABS: ORAL_TABLET | ORAL | 0 refills | Status: DC

## 2020-06-30 MED ORDER — BUDESONIDE-FORMOTEROL FUMARATE 160-4.5 MCG/ACT IN AERO: 2.0000 | INHALATION_SPRAY | Freq: Two times a day (BID) | RESPIRATORY_TRACT | 3 refills | Status: DC

## 2020-06-30 NOTE — Progress Notes (Signed)
Established Patient Office Visit  Subjective:  Patient ID: [name], male    DOB: [birth year]  Age: 70 y.o. MRN: [id number]  CC:  Chief Complaint  Patient presents with  . Follow-up    3 month follow up PMR     HPI [name] is a 70 year old male with PMH of asthma, depression, gout, PMR and erectile dysfunction who presents for follow up of PMR.  He has a h/o PMR, and had recurrent episodes of left shoulder, right knee and left foot pain, for which he was placed on steroids by his previous Rheumatologist. He was taking Prednisone 2.5 mg QD. He states that his symptoms have been better now. He has not been able to see Rheumatologist, as he could not attend the call when the office called to schedule appointment.  He still has rash and c/o intense itching over legs. He states that he has applied Permethrin 3 times, but the rash is still present.  Past Medical History:  Diagnosis Date  . Anxiety   . Arthritis    arthritis  . Asthma   . COPD (chronic obstructive pulmonary disease) (HCC)   . Depression   . Diverticulosis of large intestine without diverticulitis   . 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 Hyperlipemia 11/23/2016    Past Surgical History:  Procedure Laterality Date  . APPENDECTOMY    . CHOLECYSTECTOMY    . COLONOSCOPY N/A 11/21/2016   Procedure: COLONOSCOPY;  Surgeon: Franky Macho, MD;  [facility];  Service: Gastroenterology;  Laterality: N/A;  . HAND SURGERY      Family History  Problem Relation Age of Onset  . Diabetes Mother   . Heart disease Mother   . Heart disease Father        old age at 12  . Heart disease Brother 71       cabg  . Early death Son 75       MVA    Social History   Socioeconomic History  . Marital status: Widowed    Spouse name: Surveyor, minerals  . Number of children: 2  . Years of education: 27  . Highest education level: Not on file  Occupational History  . 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 Smoking status: Former Smoker    Packs/day: 1.00    Years: 10.00    Pack years: 10.00    Types: Cigarettes    Quit date: 02/06/1984    Years since quitting: 36.4  . Smokeless tobacco: Former Clinical biochemist  . Vaping Use: Never used  Substance and Sexual Activity  . Alcohol use: Yes    Comment: occasionally  . Drug use: No  . Sexual activity: Not Currently  Other Topics Concern  . 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 Aspirin-Salicylamide-Caffeine (BC HEADACHE POWDER PO) Take 1 Package  by mouth daily as needed.    Marland Kitchen ketoconazole 2%-triamcinolone 0.1% 1:2 cream mixture Apply topically daily. 45 g 1  . PARoxetine (PAXIL) 20 MG tablet Take 20 mg by mouth daily.   0  . predniSONE (DELTASONE) 2.5 MG tablet TAKE 1 TABLET(2.5 MG) BY MOUTH DAILY WITH BREAKFAST 30 tablet 0  . budesonide-formoterol (SYMBICORT) 160-4.5 MCG/ACT inhaler Inhale 2 puffs into the lungs 2 (two) times daily. (Patient taking differently: Inhale 2 puffs into the lungs daily.) 3 Inhaler 3  . PROAIR HFA 108 (90 Base) MCG/ACT inhaler Inhale 2 puffs into the lungs every 4 (four) hours as needed for wheezing or shortness of breath.   2  . sildenafil (REVATIO) 20 MG tablet Take 20-100 mg by mouth every 30 (thirty) days.     No facility-administered medications prior to visit.    No Known Allergies  ROS Review of Systems  Constitutional: Negative for chills and fever.  HENT: Negative for  congestion and sore throat.   Eyes: Negative for pain and discharge.  Respiratory: Negative for cough and shortness of breath.   Cardiovascular: Negative for chest pain and palpitations.  Gastrointestinal: Negative for constipation, diarrhea, nausea and vomiting.  Endocrine: Negative for polydipsia and polyuria.  Genitourinary: Negative for dysuria and hematuria.  Musculoskeletal: Positive for arthralgias. Negative for neck pain and neck stiffness.  Skin: Positive for rash.  Neurological: Negative for dizziness, weakness, numbness and headaches.  Psychiatric/Behavioral: Negative for agitation and behavioral problems.      Objective:    Physical Exam Vitals reviewed.  Constitutional:      General: He is not in acute distress.    Appearance: He is not diaphoretic.  HENT:     Head: Normocephalic and atraumatic.     Nose: Nose normal.     Mouth/Throat:     Mouth: Mucous membranes are moist.  Eyes:     General: No scleral icterus.    Extraocular Movements: Extraocular movements intact.     Pupils: Pupils are equal, round, and reactive to light.  Cardiovascular:     Rate and Rhythm: Normal rate and regular rhythm.     Pulses: Normal pulses.     Heart sounds: Normal heart sounds. No murmur heard.   Pulmonary:     Breath sounds: Normal breath sounds. No wheezing or rales.  Abdominal:     Palpations: Abdomen is soft.     Tenderness: There is no abdominal tenderness.  Musculoskeletal:     Cervical back: Neck supple. No tenderness.     Right lower leg: No edema.     Left lower leg: No edema.  Skin:    General: Skin is warm.     Findings: Rash (erythematous papules and burrows over b/l forearms and LE) present.  Neurological:     General: No focal deficit present.     Mental Status: He is alert and oriented to person, place, and time.  Psychiatric:        Mood and Affect: Mood normal.        Behavior: Behavior normal.     BP (!) 161/74 (BP Location: Right Arm, Patient  Position: Sitting, Cuff Size: Normal)   Pulse 67   Resp 18   Ht 6\' 2"  (1.88 m)   Wt 235 lb 6.4 oz (106.8 kg)   SpO2 96%   BMI 30.22 kg/m  Wt Readings from Last 3 Encounters:  06/30/20 235 lb 6.4 oz (106.8 kg)  06/15/20 230 lb (104.3 kg)  03/29/20 230 lb  6.4 oz (104.5 kg)     Health Maintenance Due  Topic Date Due  . COVID-19 Vaccine (1) Never done  . PNA vac Low Risk Adult (1 of 2 - PCV13) Never done    There are no preventive care reminders to display for this patient.  Lab Results  Component Value Date   TSH 2.55 07/05/2016   Lab Results  Component Value Date   WBC 6.9 02/03/2018   HGB 12.9 (L) 02/03/2018   HCT 40.4 02/03/2018   MCV 88.4 02/03/2018   PLT 175 02/03/2018   Lab Results  Component Value Date   NA 136 02/03/2018   K 3.7 02/03/2018   CO2 26 02/03/2018   GLUCOSE 110 (H) 02/03/2018   BUN 11 02/03/2018   CREATININE 1.18 02/03/2018   BILITOT 0.7 03/04/2016   ALKPHOS 91 03/04/2016   AST 23 03/04/2016   ALT 19 03/04/2016   PROT 6.8 03/04/2016   ALBUMIN 3.6 03/04/2016   CALCIUM 8.0 (L) 02/03/2018   ANIONGAP 7 02/03/2018   No results found for: CHOL No results found for: HDL No results found for: LDLCALC No results found for: TRIG No results found for: CHOLHDL No results found for: AOZH0Q    Assessment & Plan:   Problem List Items Addressed This Visit      Respiratory   Asthma in adult, severe persistent, uncomplicated - Primary    Well-controlled On Symbicort and PRN Albuterol      Relevant Medications   budesonide-formoterol (SYMBICORT) 160-4.5 MCG/ACT inhaler   PROAIR HFA 108 (90 Base) MCG/ACT inhaler     Other   Polymyalgia rheumatica syndrome (HCC)    Continue Prednisone 2.5 mg QD Provided contact details of Rheumatology office for appointment       Other Visit Diagnoses    Erectile dysfunction, unspecified erectile dysfunction type       Relevant Medications   sildenafil (REVATIO) 20 MG tablet   Scabies     Resistant  scabies, tried Permethrin cream Needs to clean linen as well Ivermectin prescribed Referred to Dermatology   Relevant Medications   ivermectin (STROMECTOL) 3 MG TABS tablet   Other Relevant Orders   Ambulatory referral to Dermatology      Meds ordered this encounter  Medications  . budesonide-formoterol (SYMBICORT) 160-4.5 MCG/ACT inhaler    Sig: Inhale 2 puffs into the lungs 2 (two) times daily.    Dispense:  3 each    Refill:  3  . PROAIR HFA 108 (90 Base) MCG/ACT inhaler    Sig: Inhale 2 puffs into the lungs every 4 (four) hours as needed for wheezing or shortness of breath.    Dispense:  8 g    Refill:  2  . sildenafil (REVATIO) 20 MG tablet    Sig: Take 1 tablet (20 mg total) by mouth daily as needed (Erectile dysfunction).    Dispense:  10 tablet    Refill:  1  . ivermectin (STROMECTOL) 3 MG TABS tablet    Sig: Take 7 tablets by mouth once. Take other 7 tablets 1 week later if persistent rash.    Dispense:  14 tablet    Refill:  0    Follow-up: Return in about 6 months (around 12/31/2020) for Annual physical.    Anabel Halon, MD

## 2020-06-30 NOTE — Assessment & Plan Note (Signed)
Continue Prednisone 2.5 mg QD Provided contact details of Rheumatology office for appointment

## 2020-06-30 NOTE — Patient Instructions (Signed)
Please take Ivermectin as prescribed.  Please follow up with Dermatology as scheduled.  Please follow up with Rheumatology.

## 2020-06-30 NOTE — Assessment & Plan Note (Signed)
Well-controlled On Symbicort and PRN Albuterol

## 2020-07-02 ENCOUNTER — Other Ambulatory Visit: Payer: Self-pay | Admitting: Nurse Practitioner

## 2020-07-02 LAB — CMP14+EGFR
ALT: 16 IU/L (ref 0–44)
AST: 13 IU/L (ref 0–40)
Albumin/Globulin Ratio: 2.1 (ref 1.2–2.2)
Albumin: 4.1 g/dL (ref 3.8–4.8)
Alkaline Phosphatase: 98 IU/L (ref 44–121)
BUN/Creatinine Ratio: 14 (ref 10–24)
BUN: 14 mg/dL (ref 8–27)
Bilirubin Total: 0.2 mg/dL (ref 0.0–1.2)
CO2: 23 mmol/L (ref 20–29)
Calcium: 7.9 mg/dL — ABNORMAL LOW (ref 8.6–10.2)
Chloride: 107 mmol/L — ABNORMAL HIGH (ref 96–106)
Creatinine, Ser: 1.01 mg/dL (ref 0.76–1.27)
Globulin, Total: 2 g/dL (ref 1.5–4.5)
Glucose: 96 mg/dL (ref 65–99)
Potassium: 4.6 mmol/L (ref 3.5–5.2)
Sodium: 142 mmol/L (ref 134–144)
Total Protein: 6.1 g/dL (ref 6.0–8.5)
eGFR: 81 mL/min/{1.73_m2} (ref >59)

## 2020-07-02 LAB — CBC WITH DIFFERENTIAL/PLATELET
Basophils Absolute: 0.1 10*3/uL (ref 0.0–0.2)
Basos: 1 %
EOS (ABSOLUTE): 0.2 10*3/uL (ref 0.0–0.4)
Eos: 3 %
Hematocrit: 42 % (ref 37.5–51.0)
Hemoglobin: 13.9 g/dL (ref 13.0–17.7)
Immature Grans (Abs): 0 10*3/uL (ref 0.0–0.1)
Immature Granulocytes: 0 %
Lymphocytes Absolute: 1.1 10*3/uL (ref 0.7–3.1)
Lymphs: 14 %
MCH: 29.1 pg (ref 26.6–33.0)
MCHC: 33.1 g/dL (ref 31.5–35.7)
MCV: 88 fL (ref 79–97)
Monocytes Absolute: 0.5 10*3/uL (ref 0.1–0.9)
Monocytes: 7 %
Neutrophils Absolute: 6 10*3/uL (ref 1.4–7.0)
Neutrophils: 75 %
Platelets: 244 10*3/uL (ref 150–450)
RBC: 4.77 x10E6/uL (ref 4.14–5.80)
RDW: 13.6 % (ref 11.6–15.4)
WBC: 7.9 10*3/uL (ref 3.4–10.8)

## 2020-07-02 LAB — LIPID PANEL WITH LDL/HDL RATIO
Cholesterol, Total: 193 mg/dL (ref 100–199)
HDL: 44 mg/dL (ref >39)
LDL Chol Calc (NIH): 131 mg/dL — ABNORMAL HIGH (ref 0–99)
LDL/HDL Ratio: 3 ratio (ref 0.0–3.6)
Triglycerides: 100 mg/dL (ref 0–149)
VLDL Cholesterol Cal: 18 mg/dL (ref 5–40)

## 2020-07-02 MED ORDER — CALCIUM CARBONATE-VITAMIN D 500-200 MG-UNIT PO TABS: 2.0000 | ORAL_TABLET | Freq: Every day | ORAL | 0 refills | Status: DC

## 2020-07-02 NOTE — Progress Notes (Signed)
His LDL or bad cholesterol is a little elevated, so he should try cutting out fried/fatty foods.  Also, his calcium was a little low, so I sent in a calcium/vitamin D supplement (Vit D helps absorb calcium).  Otherwise, labs look great.

## 2020-07-27 DIAGNOSIS — L281 Prurigo nodularis: Secondary | ICD-10-CM | POA: Diagnosis not present

## 2020-07-27 DIAGNOSIS — B9689 Other specified bacterial agents as the cause of diseases classified elsewhere: Secondary | ICD-10-CM | POA: Diagnosis not present

## 2020-07-27 DIAGNOSIS — L02229 Furuncle of trunk, unspecified: Secondary | ICD-10-CM | POA: Diagnosis not present

## 2020-08-03 ENCOUNTER — Telehealth: Payer: Self-pay | Admitting: Internal Medicine

## 2020-08-03 NOTE — Telephone Encounter (Signed)
Tried calling patient to  schedule Medicare Annual Wellness Visit (AWV) either virtually or in office.  AWV-I PER [place] 05/07/17  please schedule at anytime with [facility]  health coach  This should be a 40 minute visit.

## 2020-08-17 ENCOUNTER — Other Ambulatory Visit: Payer: Self-pay | Admitting: Internal Medicine

## 2020-08-17 DIAGNOSIS — J4550 Severe persistent asthma, uncomplicated: Secondary | ICD-10-CM

## 2020-10-10 ENCOUNTER — Other Ambulatory Visit: Payer: Self-pay | Admitting: Internal Medicine

## 2020-10-10 DIAGNOSIS — M353 Polymyalgia rheumatica: Secondary | ICD-10-CM

## 2020-10-25 ENCOUNTER — Telehealth: Payer: Self-pay | Admitting: Internal Medicine

## 2020-10-25 NOTE — Telephone Encounter (Signed)
  Left message for patient to call back and schedule Medicare Annual Wellness Visit (AWV) in office.   If unable to come into the office for AWV,  please offer to do virtually or by telephone.  No hx of AWV eligible for AWVI as of 05/16/2017  Please schedule at anytime with [facility]-Nurse Health Advisor.      40 Minutes appointment   Any questions, please call me at [phone]

## 2020-10-26 DIAGNOSIS — L281 Prurigo nodularis: Secondary | ICD-10-CM | POA: Diagnosis not present

## 2020-11-15 IMAGING — DX DG CHEST 2V
3 series · 3 of 3 positions shown · non-contrast
Comparison: 10/08/2017

CLINICAL DATA: Shortness of breath.  Cough.  Wheezing.  Fever.

EXAM:
CHEST - 2 VIEW

[chest lat (1 of 2)]
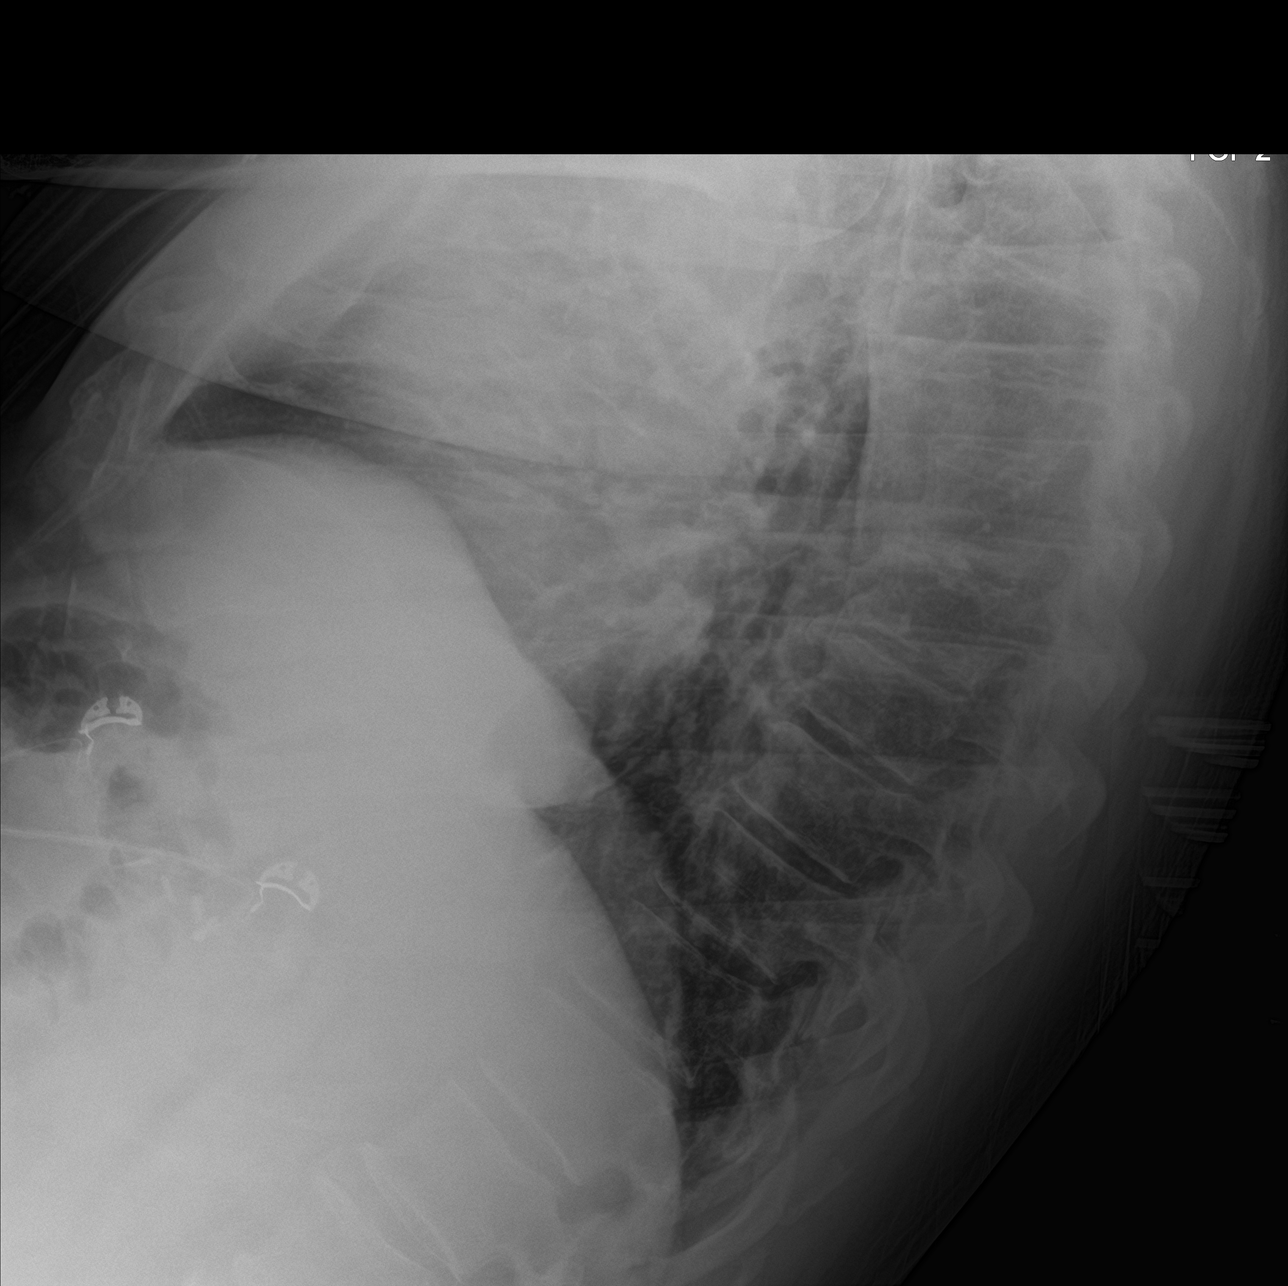

[chest ap]
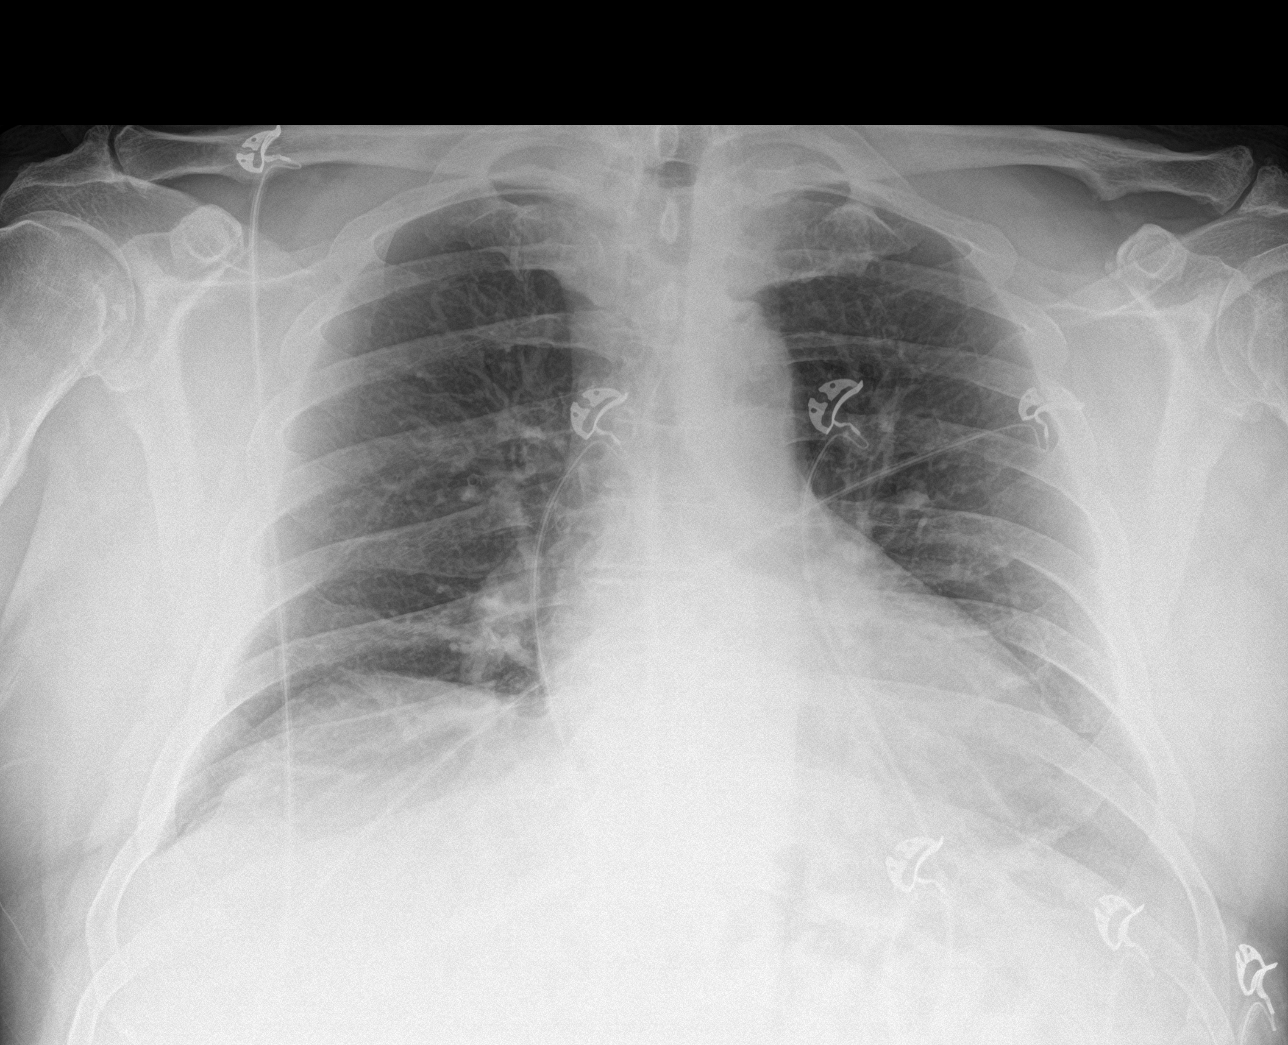

[chest lat (2 of 2)]
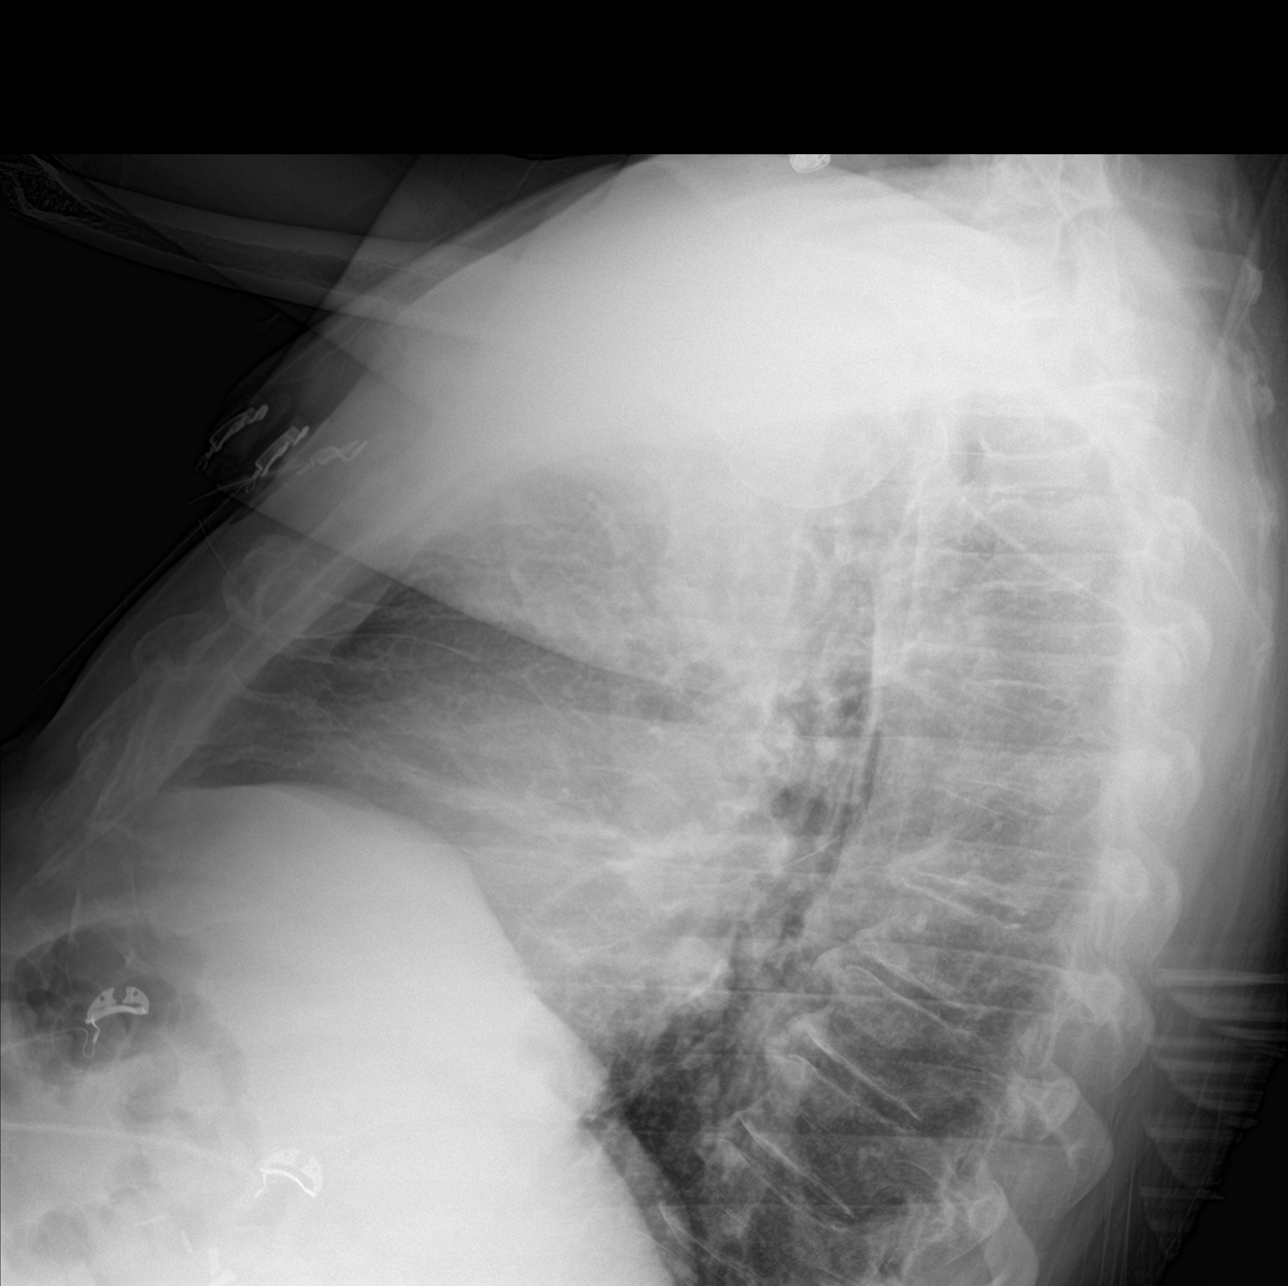

[3 of 3 positions shown; findings below may reference images not displayed]

FINDINGS: The heart size and mediastinal contours are within normal limits.
Both lungs are clear. The visualized skeletal structures are
unremarkable.
IMPRESSION: No active cardiopulmonary disease.

## 2020-11-25 ENCOUNTER — Other Ambulatory Visit: Payer: Self-pay | Admitting: Internal Medicine

## 2020-11-25 DIAGNOSIS — M353 Polymyalgia rheumatica: Secondary | ICD-10-CM

## 2020-12-07 DIAGNOSIS — R7301 Impaired fasting glucose: Secondary | ICD-10-CM | POA: Diagnosis not present

## 2020-12-07 DIAGNOSIS — L281 Prurigo nodularis: Secondary | ICD-10-CM | POA: Diagnosis not present

## 2020-12-07 DIAGNOSIS — F32A Depression, unspecified: Secondary | ICD-10-CM | POA: Diagnosis not present

## 2020-12-07 DIAGNOSIS — M25472 Effusion, left ankle: Secondary | ICD-10-CM | POA: Diagnosis not present

## 2020-12-07 DIAGNOSIS — J45909 Unspecified asthma, uncomplicated: Secondary | ICD-10-CM | POA: Diagnosis not present

## 2020-12-09 DIAGNOSIS — L281 Prurigo nodularis: Secondary | ICD-10-CM | POA: Diagnosis not present

## 2021-01-06 ENCOUNTER — Encounter: Payer: PPO | Admitting: Internal Medicine

## 2021-02-28 DIAGNOSIS — J4540 Moderate persistent asthma, uncomplicated: Secondary | ICD-10-CM | POA: Diagnosis not present

## 2021-02-28 DIAGNOSIS — Z6828 Body mass index (BMI) 28.0-28.9, adult: Secondary | ICD-10-CM | POA: Diagnosis not present

## 2021-02-28 DIAGNOSIS — L281 Prurigo nodularis: Secondary | ICD-10-CM | POA: Diagnosis not present

## 2021-02-28 DIAGNOSIS — I2510 Atherosclerotic heart disease of native coronary artery without angina pectoris: Secondary | ICD-10-CM | POA: Diagnosis not present

## 2021-03-10 DIAGNOSIS — J4540 Moderate persistent asthma, uncomplicated: Secondary | ICD-10-CM | POA: Diagnosis not present

## 2021-03-10 DIAGNOSIS — L281 Prurigo nodularis: Secondary | ICD-10-CM | POA: Diagnosis not present

## 2021-03-10 DIAGNOSIS — E785 Hyperlipidemia, unspecified: Secondary | ICD-10-CM | POA: Diagnosis not present

## 2021-03-10 DIAGNOSIS — F32A Depression, unspecified: Secondary | ICD-10-CM | POA: Diagnosis not present

## 2021-03-28 ENCOUNTER — Other Ambulatory Visit: Payer: Self-pay

## 2021-03-28 NOTE — Progress Notes (Signed)
This encounter was created in error - please disregard.

## 2021-03-29 ENCOUNTER — Other Ambulatory Visit: Payer: Self-pay | Admitting: Internal Medicine

## 2021-03-29 DIAGNOSIS — M353 Polymyalgia rheumatica: Secondary | ICD-10-CM

## 2021-04-15 DIAGNOSIS — H26493 Other secondary cataract, bilateral: Secondary | ICD-10-CM | POA: Diagnosis not present

## 2021-04-15 DIAGNOSIS — H26492 Other secondary cataract, left eye: Secondary | ICD-10-CM | POA: Diagnosis not present

## 2021-05-23 DIAGNOSIS — L853 Xerosis cutis: Secondary | ICD-10-CM | POA: Diagnosis not present

## 2021-05-23 DIAGNOSIS — L281 Prurigo nodularis: Secondary | ICD-10-CM | POA: Diagnosis not present

## 2021-07-26 ENCOUNTER — Other Ambulatory Visit: Payer: Self-pay | Admitting: Internal Medicine

## 2021-07-26 DIAGNOSIS — M353 Polymyalgia rheumatica: Secondary | ICD-10-CM

## 2021-08-25 ENCOUNTER — Ambulatory Visit: Payer: PPO | Admitting: Dermatology

## 2021-08-28 ENCOUNTER — Ambulatory Visit
Admission: EM | Admit: 2021-08-28 | Discharge: 2021-08-28 | Disposition: A | Discharge status: Home / Self Care | Payer: PPO | Attending: Physician Assistant | Admitting: Physician Assistant

## 2021-08-28 ENCOUNTER — Ambulatory Visit (INDEPENDENT_AMBULATORY_CARE_PROVIDER_SITE_OTHER): Payer: PPO

## 2021-08-28 DIAGNOSIS — M19071 Primary osteoarthritis, right ankle and foot: Secondary | ICD-10-CM | POA: Diagnosis not present

## 2021-08-28 DIAGNOSIS — S91331A Puncture wound without foreign body, right foot, initial encounter: Secondary | ICD-10-CM | POA: Diagnosis not present

## 2021-08-28 DIAGNOSIS — L089 Local infection of the skin and subcutaneous tissue, unspecified: Secondary | ICD-10-CM

## 2021-08-28 DIAGNOSIS — T380X5A Adverse effect of glucocorticoids and synthetic analogues, initial encounter: Secondary | ICD-10-CM

## 2021-08-28 MED ORDER — LEVOFLOXACIN 750 MG PO TABS: 750.0000 mg | ORAL_TABLET | Freq: Every day | ORAL | 0 refills | Status: AC

## 2021-08-28 MED ORDER — CEFTRIAXONE SODIUM 1 G IJ SOLR
1.0000 g | Freq: Once | INTRAMUSCULAR | Status: AC
  Administered 2021-08-28: 1 g via INTRAMUSCULAR

## 2021-08-28 NOTE — Discharge Instructions (Signed)
-  Your x-ray was normal.  There is no x-ray evidence of damage to the bone or bony infection. - As we discussed, there are different bacteria's that can infect a foot with a puncture wound.  The antibiotic you are on does not cover a bacteria called Pseudomonas which is likely what is causing your infection so I have sent in Levaquin for you.  Just finish out the doxycycline since you already started it. -We did give you an injection of Rocephin antibiotic in clinic today. - Continue to clean the wound with soap and water.  Avoid soaking it at this time. - You can have Tylenol for pain relief if needed. - You should not have any fevers after the next couple of days. - Please follow-up with your PCP or return here in 2 to 3 days for reexamination to make sure the infection is improving and resolving onset of worsening. - If you have a return of the fever, worsening pain/redness or swelling you should go to the emergency department.

## 2021-08-28 NOTE — ED Provider Notes (Signed)
[facility]    CSN: [id number] Arrival date & time: 08/28/21  0834      History   Chief Complaint Chief Complaint  Patient presents with   Foot Injury    HPI [name] is a 71 y.o. male presenting for right foot pain.  Patient reports that he stepped on a nail 6 days ago.  He saw his doctor the following day and reports that he was placed on doxycycline which he has been taking for the past 5 days.  Despite that, he says he has had increased swelling and pain as well as a fever up to 101 degrees.  The fever was 3 days ago and he has not had a temperature since.  Patient reports that his tetanus was updated at his doctor's appointment.  He states he did not have an x-ray of his foot and would like to have one.  He is concerned about worsening infection.  He reports he has been soaking the foot and cleaning it well.  He reports that he was stepped on several nails throughout his life.  The last time he stepped on the nail was in 2021 and it also got infected.  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  Additionally has history of asthma, COPD, arthritis, anxiety/depression and hyperlipidemia.  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 Authorizing Provider  Aspirin-Salicylamide-Caffeine (BC HEADACHE POWDER PO) Take 1 Package by mouth daily as needed.   Yes [provider]  budesonide-formoterol (SYMBICORT) 160-4.5 MCG/ACT inhaler Inhale 2 puffs into the lungs 2 (two) times daily. 06/30/20  Yes Anabel Halon, MD  calcium-vitamin D (OSCAL WITH D) 500-200 MG-UNIT tablet Take 2 tablets by mouth daily with breakfast. 07/02/20  Yes Heather Roberts, NP  ivermectin (STROMECTOL) 3 MG TABS tablet Take 7 tablets by mouth once. Take other 7 tablets 1 week later if persistent rash. 06/30/20  Yes Anabel Halon, MD  ketoconazole 2%-triamcinolone 0.1% 1:2 cream mixture Apply topically daily. 06/15/20  Yes Heather Roberts, NP  levofloxacin (LEVAQUIN) 750 MG tablet Take 1 tablet (750 mg total) by mouth daily for 10 days. 08/28/21 09/07/21 Yes Shirlee Latch, PA-C  PARoxetine (PAXIL) 20 MG tablet Take 20 mg by mouth  daily.  03/08/15  Yes [provider]  predniSONE (DELTASONE) 2.5 MG tablet TAKE 1 TABLET(2.5 MG) BY MOUTH DAILY WITH BREAKFAST 07/26/21  Yes Anabel Halon, MD  PROAIR HFA 108 [phone] Base) MCG/ACT inhaler INHALE 2 PUFFS INTO THE LUNGS EVERY 4 HOURS AS NEEDED FOR WHEEZING OR SHORTNESS OF BREATH 08/17/20  Yes Anabel Halon, MD  sildenafil (REVATIO) 20 MG tablet Take 1 tablet (20 mg total) by mouth daily as needed (Erectile dysfunction). 06/30/20  Yes Anabel Halon, MD    Family History Family History  Problem Relation Age of Onset   Diabetes Mother    Heart disease Mother     Heart disease Father        old age at 69   Heart disease Brother 87       cabg   Early death Son 61       MVA    Social History Social History   Tobacco Use   Smoking status: Former    Packs/day: 1.00    Years: 10.00    Total pack years: 10.00    Types: Cigarettes    Quit date: 02/06/1984    Years since quitting: 37.5   Smokeless tobacco: Former  Building services engineer Use: Never used  Substance Use Topics   Alcohol use: Not Currently    Comment: occasionally   Drug use: No     Allergies   Patient has no known allergies.   Review of Systems Review of Systems  Constitutional:  Positive for fever. Negative for fatigue.  Musculoskeletal:  Positive for arthralgias and joint swelling. Negative for gait problem.  Skin:  Positive for color change and wound.  Neurological:  Negative for weakness and numbness.     Physical Exam Triage Vital Signs ED Triage Vitals  Enc Vitals Group     BP      Pulse      Resp      Temp      Temp src      SpO2      Weight      Height      Head Circumference      Peak Flow      Pain Score      Pain Loc      Pain Edu?      Excl. in GC?    No data found.  Updated Vital Signs BP (!) 149/87 (BP Location: Left Arm)   Pulse (!) 58   Temp 98.2 F (36.8 C) (Oral)   Resp 18   Ht 6\' 2"  (1.88 m)   Wt 230 lb (104.3 kg)   SpO2 98%   BMI 29.53 kg/m     Physical Exam Vitals and nursing note reviewed.  Constitutional:      General: He is not in acute distress.    Appearance: Normal appearance. He is well-developed. He is not ill-appearing.  HENT:     Head: Normocephalic and atraumatic.  Eyes:     General: No scleral icterus.    Conjunctiva/sclera: Conjunctivae normal.  Cardiovascular:     Rate and Rhythm: Bradycardia present.     Pulses: Normal pulses.  Pulmonary:     Effort: Pulmonary effort is normal. No respiratory distress.  Musculoskeletal:     Cervical back: Neck supple.  Skin:    General: Skin is warm and dry.      Capillary Refill: Capillary refill takes less than 2 seconds.     Comments: See  images below.  There is a puncture of the lateral plantar foot.  Surrounding erythema and swelling.  No other photo taken of the dorsal foot shows increased swelling, redness of the lateral foot.  No puncture of the dorsal foot.  Both areas are tender to palpation.  There is no bleeding or drainage.  Neurological:     General: No focal deficit present.     Mental Status: He is alert. Mental status is at baseline.     Motor: No weakness.     Gait: Gait abnormal.  Psychiatric:        Mood and Affect: Mood normal.        Behavior: Behavior normal.         UC Treatments / Results  Labs (all labs ordered are listed, but only abnormal results are displayed) Labs Reviewed - No data to display  EKG   Radiology DG Foot Complete Right  Result Date: 08/28/2021 CLINICAL DATA:  Stepped on a nail 5 days ago.  Redness and swelling. EXAM: RIGHT FOOT COMPLETE - 3+ VIEW COMPARISON:  None Available. FINDINGS: No evidence for an acute fracture. No subluxation or dislocation. Degenerative changes are noted in scattered IP and MTP joints. No gross bony destruction to suggest osteomyelitis. No evidence for retained radiopaque soft tissue foreign body. IMPRESSION: No acute bony abnormality. No evidence for retained radiopaque soft tissue foreign body. Electronically Signed   By: Kennith Center M.D.   On: 08/28/2021 09:18    Procedures Procedures (including critical care time)  Medications Ordered in UC Medications  cefTRIAXone (ROCEPHIN) injection 1 g (1 g Intramuscular Given 08/28/21 0926)    Initial Impression / Assessment and Plan / UC Course  I have reviewed the triage vital signs and the nursing notes.  Pertinent labs & imaging results that were available during my care of the patient were reviewed by me and considered in my medical decision making (see chart for details).  71 year old male presenting for right  foot pain, swelling and redness.  Patient stepped on a nail 6 days ago.  Has been on doxycycline for 5 days with worsening symptoms.  Tetanus updated last week.  BP elevated at 149/87.  He is afebrile.  Images taken of his foot.  He does have a puncture wound of the plantar foot and surrounding erythema and swelling as well as tenderness in this area.  He also has swelling, erythema and tenderness of the dorsal lateral foot.  X-ray of right foot obtained.  Assessing for potential foreign body and/or bony injury.  X-ray of foot reviewed by me.  X-ray shows no evidence of any abnormality.  Discussed this with patient.  Patient has obvious cellulitis secondary to the puncture wound.  Suspect likely gram-negative bacteria given that the doxycycline has not helped and symptoms have worsened since.  We will have him finish out the doxycycline since he has started it.  Added Levaquin.  Patient given a gram of Rocephin in clinic today.  Advise close monitoring and cleaning it well.  Advised avoiding soaking it.  Advise close monitoring and follow-up with PCP in the next couple of days or return to our clinic.  Reviewed ED precautions.   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  There is no x-ray evidence of damage  to the bone or bony infection. - As we discussed, there are different bacteria's that can infect a foot with a puncture wound.  The antibiotic you are on does not cover a bacteria called Pseudomonas which is likely what is causing your infection so I have sent in Levaquin for you.  Just finish out the doxycycline since you already started it. -We did give you an injection of Rocephin antibiotic in clinic today. - Continue to clean the wound with soap and water.  Avoid soaking it at this  time. - You can have Tylenol for pain relief if needed. - You should not have any fevers after the next couple of days. - Please follow-up with your PCP or return here in 2 to 3 days for reexamination to make sure the infection is improving and resolving onset of worsening. - If you have a return of the fever, worsening pain/redness or swelling you should go to the emergency department.     ED Prescriptions     Medication Sig Dispense Auth. Provider   levofloxacin (LEVAQUIN) 750 MG tablet Take 1 tablet (750 mg total) by mouth daily for 10 days. 10 tablet Gareth Morgan      PDMP not reviewed this encounter.   Shirlee Latch, PA-C 08/28/21 [phone]

## 2021-08-28 NOTE — ED Triage Notes (Signed)
Pt c/o stepping on a nail on Monday. Pt was seen by a doctor on Tuesday.  Pt states that he is having swelling and pain and believes it is infected. Pt had a temperature of 101 on Thursday.  Pt was given doxycycline at [facility]  Pt has taken 2 doxycycline daily since Tuesday.   Pt asks to have an xray of his foot. Pt states that the nail went almost through his foot and no imagine was taken at [facility].   Pt was given a tetanus shot on Tuesday.

## 2021-09-16 ENCOUNTER — Other Ambulatory Visit: Payer: Self-pay

## 2021-09-16 ENCOUNTER — Emergency Department (HOSPITAL_COMMUNITY): Payer: PPO

## 2021-09-16 ENCOUNTER — Emergency Department (HOSPITAL_COMMUNITY)
Admission: EM | Admit: 2021-09-16 | Discharge: 2021-09-16 | Disposition: A | Discharge status: Home / Self Care | Payer: PPO | Attending: Emergency Medicine | Admitting: Emergency Medicine

## 2021-09-16 ENCOUNTER — Telehealth: Payer: Self-pay | Admitting: Orthopedic Surgery

## 2021-09-16 ENCOUNTER — Encounter (HOSPITAL_COMMUNITY): Payer: Self-pay | Admitting: Emergency Medicine

## 2021-09-16 DIAGNOSIS — M19071 Primary osteoarthritis, right ankle and foot: Secondary | ICD-10-CM | POA: Diagnosis not present

## 2021-09-16 DIAGNOSIS — M868X7 Other osteomyelitis, ankle and foot: Secondary | ICD-10-CM | POA: Diagnosis not present

## 2021-09-16 DIAGNOSIS — M86171 Other acute osteomyelitis, right ankle and foot: Secondary | ICD-10-CM | POA: Diagnosis not present

## 2021-09-16 DIAGNOSIS — R600 Localized edema: Secondary | ICD-10-CM | POA: Diagnosis not present

## 2021-09-16 DIAGNOSIS — M79671 Pain in right foot: Secondary | ICD-10-CM | POA: Diagnosis not present

## 2021-09-16 DIAGNOSIS — M869 Osteomyelitis, unspecified: Secondary | ICD-10-CM | POA: Diagnosis not present

## 2021-09-16 DIAGNOSIS — M7989 Other specified soft tissue disorders: Secondary | ICD-10-CM | POA: Diagnosis not present

## 2021-09-16 LAB — CBC WITH DIFFERENTIAL/PLATELET
Abs Immature Granulocytes: 0.03 10*3/uL (ref 0.00–0.07)
Basophils Absolute: 0.1 10*3/uL (ref 0.0–0.1)
Basophils Relative: 1 %
Eosinophils Absolute: 0.4 10*3/uL (ref 0.0–0.5)
Eosinophils Relative: 4 %
HCT: 41.4 % (ref 39.0–52.0)
Hemoglobin: 13.9 g/dL (ref 13.0–17.0)
Immature Granulocytes: 0 %
Lymphocytes Relative: 18 %
Lymphs Abs: 1.5 10*3/uL (ref 0.7–4.0)
MCH: 30.2 pg (ref 26.0–34.0)
MCHC: 33.6 g/dL (ref 30.0–36.0)
MCV: 89.8 fL (ref 80.0–100.0)
Monocytes Absolute: 0.7 10*3/uL (ref 0.1–1.0)
Monocytes Relative: 8 %
Neutro Abs: 6 10*3/uL (ref 1.7–7.7)
Neutrophils Relative %: 69 %
Platelets: 239 10*3/uL (ref 150–400)
RBC: 4.61 MIL/uL (ref 4.22–5.81)
RDW: 13 % (ref 11.5–15.5)
WBC: 8.6 10*3/uL (ref 4.0–10.5)
nRBC: 0 % (ref 0.0–0.2)

## 2021-09-16 LAB — BASIC METABOLIC PANEL
Anion gap: 3 — ABNORMAL LOW (ref 5–15)
BUN: 17 mg/dL (ref 8–23)
CO2: 27 mmol/L (ref 22–32)
Calcium: 8.7 mg/dL — ABNORMAL LOW (ref 8.9–10.3)
Chloride: 106 mmol/L (ref 98–111)
Creatinine, Ser: 1.02 mg/dL (ref 0.61–1.24)
GFR, Estimated: >60 mL/min (ref >60)
Glucose, Bld: 90 mg/dL (ref 70–99)
Potassium: 4.6 mmol/L (ref 3.5–5.1)
Sodium: 136 mmol/L (ref 135–145)

## 2021-09-16 LAB — C-REACTIVE PROTEIN: CRP: 0.7 mg/dL (ref <1.0)

## 2021-09-16 LAB — SEDIMENTATION RATE: Sed Rate: 5 mm/hr (ref 0–16)

## 2021-09-16 MED ORDER — NAPROXEN 375 MG PO TABS
375.0000 mg | ORAL_TABLET | Freq: Two times a day (BID) | ORAL | 0 refills | Status: DC
Start: 2021-09-16 — End: 2023-05-07

## 2021-09-16 NOTE — Telephone Encounter (Signed)
Patient called to inquire about scheduling appointment for puncture injury to right foot; states, per Epic chart notes, treated at [facility] in [place] on 08/28/21. States he was also treated by primary care doctor since then. States he is having pain in the foot; said it feels like something is still in the foot. Please review and advise.

## 2021-09-16 NOTE — Discharge Instructions (Signed)
Take the medication as directed.  Contact the orthopedic office listed to arrange a follow-up appointment for next week.

## 2021-09-16 NOTE — ED Triage Notes (Signed)
Pt presents with right foot pain, after stepping on nail several weeks ago, place on abt, completed course of meds on yesterday, started having pain and difficulty walking on foot on yesterday.

## 2021-09-20 NOTE — ED Provider Notes (Signed)
[facility] EMERGENCY DEPARTMENT Provider Note   CSN: [id number] Arrival date & time: 09/16/21  1036     History  Chief Complaint  Patient presents with   Foot Wound    [name] is a 71 y.o. male.  HPI     [name] is a 71 y.o. male who presents to the Emergency Department complaining of persistent right foot pain.  Stepped on a large nail several weeks ago that pierced through a soft sole shoe and through the dorsal foot.  He was initially seen at [facility] and prescribed doxycycline then switched to Levaquin.  He states the pain seem to be improving while on the Levaquin but he completed the abx one day ago and now the pain has returned.  He reports significant pain with weight bearing.  He admits to increased walking recently.  He denies chills, fever, redness of his foot or red streaking.  No numbness or swelling of the foot.    Home Medications Prior to Admission medications   Medication Sig Start Date End Date Taking? Authorizing Provider  naproxen (NAPROSYN) 375 MG tablet Take 1 tablet (375 mg total) by mouth 2 (two) times daily. Take with food 09/16/21  Yes Lavern Maslow, PA-C  Aspirin-Salicylamide-Caffeine (BC HEADACHE POWDER PO) Take 1 Package by mouth daily as needed.    [provider]  budesonide-formoterol (SYMBICORT) 160-4.5 MCG/ACT inhaler Inhale 2 puffs into the lungs 2 (two) times daily. 06/30/20   Anabel Halon, MD  calcium-vitamin D (OSCAL WITH D) 500-200 MG-UNIT tablet Take 2 tablets by mouth daily with breakfast. 07/02/20   Heather Roberts, NP  ivermectin (STROMECTOL) 3 MG TABS tablet Take 7 tablets by mouth once. Take other 7 tablets 1 week later if persistent rash. 06/30/20   Anabel Halon, MD  ketoconazole 2%-triamcinolone 0.1% 1:2 cream mixture Apply topically daily. 06/15/20   Heather Roberts, NP  PARoxetine (PAXIL) 20 MG tablet Take 20 mg by mouth daily.  03/08/15   [provider]  predniSONE (DELTASONE) 2.5 MG tablet TAKE 1  TABLET(2.5 MG) BY MOUTH DAILY WITH BREAKFAST 07/26/21   Anabel Halon, MD  PROAIR HFA 108 [phone] Base) MCG/ACT inhaler INHALE 2 PUFFS INTO THE LUNGS EVERY 4 HOURS AS NEEDED FOR WHEEZING OR SHORTNESS OF BREATH 08/17/20   Anabel Halon, MD  sildenafil (REVATIO) 20 MG tablet Take 1 tablet (20 mg total) by mouth daily as needed (Erectile dysfunction). 06/30/20   Anabel Halon, MD      Allergies    Patient has no known allergies.    Review of Systems   Review of Systems  Constitutional:  Negative for chills and fever.  Respiratory:  Negative for shortness of breath.   Cardiovascular:  Negative for chest pain.  Gastrointestinal:  Negative for abdominal pain, nausea and vomiting.  Musculoskeletal:  Positive for arthralgias (right foot pain) and gait problem. Negative for joint swelling.  Skin:  Negative for color change and rash.  Neurological:  Negative for weakness and numbness.    Physical Exam Updated Vital Signs BP (!) 140/80 (BP Location: Right Arm)   Pulse 60   Temp 98 F (36.7 C) (Oral)   Resp 16   Ht 6\' 2"  (1.88 m)   Wt 104.3 kg   SpO2 95%   BMI 29.53 kg/m  Physical Exam Vitals and nursing note reviewed.  Constitutional:      General: He is not in acute distress.    Appearance: Normal appearance.  HENT:     Head: Atraumatic.  Cardiovascular:     Rate and Rhythm: Normal rate.     Pulses: Normal pulses.  Pulmonary:     Effort: Pulmonary effort is normal.     Breath sounds: Normal breath sounds.  Abdominal:     Palpations: Abdomen is soft.     Tenderness: There is no abdominal tenderness.  Musculoskeletal:        General: Tenderness and signs of injury present. No swelling. Normal range of motion.     Comments: Healing puncture wound to the plantar aspect of the right lateral foot.  No lymphangitis, erythema or edema.  Right calf soft and non tender  Neurological:     Mental Status: He is alert.     ED Results / Procedures / Treatments   Labs (all labs ordered  are listed, but only abnormal results are displayed) Labs Reviewed  BASIC METABOLIC PANEL - Abnormal; Notable for the following components:      Result Value   Calcium 8.7 (*)    Anion gap 3 (*)    All other components within normal limits  CBC WITH DIFFERENTIAL/PLATELET  C-REACTIVE PROTEIN  SEDIMENTATION RATE    EKG None  Radiology No results found.  Procedures Procedures    Medications Ordered in ED Medications - No data to display  ED Course/ Medical Decision Making/ A&P Clinical Course as of 09/20/21 0848  Fri Sep 16, 2021  1347 Patient here for evaluation of a worsening puncture wound from a nail.  Increased pain to right lateral foot.  No drainage.  Has been on 2 courses of antibiotics with worsening symptoms.  Orthopedics recommending MRI foot.  Disposition per results of testing. [MB]    Clinical Course User Index [MB] Terrilee Files, MD                           Medical Decision Making Pt here with pain to foot several weeks after completing abx course.    On exam, previous puncture wound site appears to be healing well.  I do not appreciate any edema or erythema of the foot.  NV intact.  He has good cap refill and no lymphangitis.  Diff dx wound include but not limited to inflammatory response, osteomyelitis, cellulitis,   Amount and/or Complexity of Data Reviewed Labs: ordered.    Details: labs unremarkable.  sed rate reassuring. CRP pending Radiology: ordered.    Details: XR of foot shows area of concern for osteomyelitis. Discussion of management or test interpretation with external provider(s): Consulted orthopedics, Dr. Aundria Rud, who recommends MRI foot and will see pt in clinic for prompt f/u, possible inflammatory process. Discussed additional abx, but given pt just completed levaquin and has reassuring labs and exam, additional abx was withheld until his f/u  Risk Prescription drug management.           Final Clinical Impression(s) / ED  Diagnoses Final diagnoses:  Right foot pain  Osteomyelitis of right foot, unspecified type [facility])    Rx / DC Orders ED Discharge Orders          Ordered    naproxen (NAPROSYN) 375 MG tablet  2 times daily        09/16/21 1438              Pauline Aus, PA-C 09/20/21 0915    Terrilee Files, MD 09/20/21 [phone]

## 2021-09-21 DIAGNOSIS — M79671 Pain in right foot: Secondary | ICD-10-CM | POA: Diagnosis not present

## 2021-09-21 DIAGNOSIS — M86171 Other acute osteomyelitis, right ankle and foot: Secondary | ICD-10-CM | POA: Diagnosis not present

## 2021-09-21 NOTE — Telephone Encounter (Signed)
Have been unable to reach patient due to voice mail full; called again, same. Closing note due to no further response from patient.

## 2021-09-22 ENCOUNTER — Other Ambulatory Visit (HOSPITAL_COMMUNITY): Payer: Self-pay | Admitting: Orthopedic Surgery

## 2021-09-23 ENCOUNTER — Encounter (HOSPITAL_BASED_OUTPATIENT_CLINIC_OR_DEPARTMENT_OTHER): Payer: Self-pay | Admitting: Orthopedic Surgery

## 2021-09-23 ENCOUNTER — Other Ambulatory Visit: Payer: Self-pay

## 2021-09-29 ENCOUNTER — Other Ambulatory Visit: Payer: Self-pay

## 2021-09-29 ENCOUNTER — Telehealth: Payer: Self-pay | Admitting: Internal Medicine

## 2021-09-29 ENCOUNTER — Ambulatory Visit (HOSPITAL_BASED_OUTPATIENT_CLINIC_OR_DEPARTMENT_OTHER): Payer: PPO | Admitting: Certified Registered"

## 2021-09-29 ENCOUNTER — Ambulatory Visit (HOSPITAL_BASED_OUTPATIENT_CLINIC_OR_DEPARTMENT_OTHER)
Admission: RE | Admit: 2021-09-29 | Discharge: 2021-09-29 | Disposition: A | Discharge status: Home / Self Care | Payer: PPO | Attending: Orthopedic Surgery | Admitting: Orthopedic Surgery

## 2021-09-29 ENCOUNTER — Ambulatory Visit (HOSPITAL_BASED_OUTPATIENT_CLINIC_OR_DEPARTMENT_OTHER): Payer: PPO

## 2021-09-29 ENCOUNTER — Encounter (HOSPITAL_BASED_OUTPATIENT_CLINIC_OR_DEPARTMENT_OTHER): Payer: Self-pay | Admitting: Orthopedic Surgery

## 2021-09-29 ENCOUNTER — Encounter (HOSPITAL_BASED_OUTPATIENT_CLINIC_OR_DEPARTMENT_OTHER)
Admission: RE | Disposition: A | Discharge status: Home / Self Care | Payer: Self-pay | Source: Home / Self Care | Attending: Orthopedic Surgery

## 2021-09-29 DIAGNOSIS — J449 Chronic obstructive pulmonary disease, unspecified: Secondary | ICD-10-CM | POA: Insufficient documentation

## 2021-09-29 DIAGNOSIS — M1990 Unspecified osteoarthritis, unspecified site: Secondary | ICD-10-CM | POA: Insufficient documentation

## 2021-09-29 DIAGNOSIS — F418 Other specified anxiety disorders: Secondary | ICD-10-CM | POA: Insufficient documentation

## 2021-09-29 DIAGNOSIS — Z7951 Long term (current) use of inhaled steroids: Secondary | ICD-10-CM | POA: Diagnosis not present

## 2021-09-29 DIAGNOSIS — Z87891 Personal history of nicotine dependence: Secondary | ICD-10-CM | POA: Insufficient documentation

## 2021-09-29 DIAGNOSIS — Z01818 Encounter for other preprocedural examination: Secondary | ICD-10-CM

## 2021-09-29 DIAGNOSIS — S91331S Puncture wound without foreign body, right foot, sequela: Secondary | ICD-10-CM

## 2021-09-29 DIAGNOSIS — M869 Osteomyelitis, unspecified: Secondary | ICD-10-CM

## 2021-09-29 DIAGNOSIS — M86171 Other acute osteomyelitis, right ankle and foot: Secondary | ICD-10-CM | POA: Diagnosis not present

## 2021-09-29 DIAGNOSIS — M898X7 Other specified disorders of bone, ankle and foot: Secondary | ICD-10-CM | POA: Diagnosis not present

## 2021-09-29 HISTORY — PX: BONE BIOPSY: SHX375

## 2021-09-29 SURGERY — BIOPSY, BONE
Anesthesia: Monitor Anesthesia Care | Site: Foot | Laterality: Right

## 2021-09-29 MED ORDER — ACETAMINOPHEN 10 MG/ML IV SOLN: 1000.0000 mg | Freq: Once | INTRAVENOUS | Status: DC | PRN

## 2021-09-29 MED ORDER — DEXAMETHASONE SODIUM PHOSPHATE 10 MG/ML IJ SOLN
INTRAMUSCULAR | Status: DC | PRN
  Administered 2021-09-29: 5 mg via INTRAVENOUS

## 2021-09-29 MED ORDER — FENTANYL CITRATE (PF) 100 MCG/2ML IJ SOLN: 25.0000 ug | INTRAMUSCULAR | Status: DC | PRN

## 2021-09-29 MED ORDER — LACTATED RINGERS IV SOLN: INTRAVENOUS | Status: DC

## 2021-09-29 MED ORDER — DOXYCYCLINE HYCLATE 100 MG PO TABS: 100.0000 mg | ORAL_TABLET | Freq: Two times a day (BID) | ORAL | 0 refills | Status: DC

## 2021-09-29 MED ORDER — CEFAZOLIN SODIUM-DEXTROSE 2-4 GM/100ML-% IV SOLN
2.0000 g | INTRAVENOUS | Status: AC
  Administered 2021-09-29: 2 g via INTRAVENOUS

## 2021-09-29 MED ORDER — HYDROCODONE-ACETAMINOPHEN 5-325 MG PO TABS: 1.0000 | ORAL_TABLET | Freq: Four times a day (QID) | ORAL | 0 refills | Status: AC | PRN

## 2021-09-29 MED ORDER — SODIUM CHLORIDE 0.9 % IR SOLN
Status: DC | PRN
  Administered 2021-09-29: 3000 mL

## 2021-09-29 MED ORDER — EPHEDRINE SULFATE-NACL 50-0.9 MG/10ML-% IV SOSY
PREFILLED_SYRINGE | INTRAVENOUS | Status: DC | PRN
  Administered 2021-09-29 (×2): 10 mg via INTRAVENOUS

## 2021-09-29 MED ORDER — FENTANYL CITRATE (PF) 100 MCG/2ML IJ SOLN
INTRAMUSCULAR | Status: DC | PRN
  Administered 2021-09-29 (×3): 50 ug via INTRAVENOUS

## 2021-09-29 MED ORDER — BUPIVACAINE-EPINEPHRINE 0.5% -1:200000 IJ SOLN
INTRAMUSCULAR | Status: DC | PRN
  Administered 2021-09-29: 10 mL

## 2021-09-29 MED ORDER — DEXMEDETOMIDINE (PRECEDEX) IN NS 20 MCG/5ML (4 MCG/ML) IV SYRINGE
PREFILLED_SYRINGE | INTRAVENOUS | Status: DC | PRN
  Administered 2021-09-29: 4 ug via INTRAVENOUS

## 2021-09-29 MED ORDER — AMOXICILLIN-POT CLAVULANATE 500-125 MG PO TABS: 1.0000 | ORAL_TABLET | Freq: Three times a day (TID) | ORAL | 0 refills | Status: DC

## 2021-09-29 MED ORDER — FENTANYL CITRATE (PF) 100 MCG/2ML IJ SOLN
INTRAMUSCULAR | Status: AC
  Filled 2021-09-29: qty 2

## 2021-09-29 MED ORDER — SODIUM CHLORIDE 0.9 % IV SOLN: INTRAVENOUS | Status: DC

## 2021-09-29 MED ORDER — EPHEDRINE 5 MG/ML INJ
INTRAVENOUS | Status: AC
  Filled 2021-09-29: qty 10

## 2021-09-29 MED ORDER — GENTAMICIN SULFATE 40 MG/ML IJ SOLN
INTRAMUSCULAR | Status: AC
  Filled 2021-09-29: qty 4

## 2021-09-29 MED ORDER — VANCOMYCIN HCL 500 MG IV SOLR
INTRAVENOUS | Status: AC
  Filled 2021-09-29: qty 10

## 2021-09-29 MED ORDER — LIDOCAINE HCL (CARDIAC) PF 100 MG/5ML IV SOSY
PREFILLED_SYRINGE | INTRAVENOUS | Status: DC | PRN
  Administered 2021-09-29: 100 mg via INTRAVENOUS

## 2021-09-29 MED ORDER — ONDANSETRON HCL 4 MG/2ML IJ SOLN
INTRAMUSCULAR | Status: DC | PRN
  Administered 2021-09-29: 4 mg via INTRAVENOUS

## 2021-09-29 MED ORDER — GENTAMICIN SULFATE 40 MG/ML IJ SOLN
INTRAMUSCULAR | Status: DC | PRN
  Administered 2021-09-29: 160 mg

## 2021-09-29 MED ORDER — PROPOFOL 10 MG/ML IV BOLUS
INTRAVENOUS | Status: DC | PRN
  Administered 2021-09-29: 200 mg via INTRAVENOUS

## 2021-09-29 MED ORDER — CEFAZOLIN SODIUM-DEXTROSE 2-4 GM/100ML-% IV SOLN
INTRAVENOUS | Status: AC
  Filled 2021-09-29: qty 100

## 2021-09-29 MED ORDER — VANCOMYCIN HCL 500 MG IV SOLR
INTRAVENOUS | Status: DC | PRN
  Administered 2021-09-29: 500 mg

## 2021-09-29 MED ORDER — PROPOFOL 10 MG/ML IV BOLUS
INTRAVENOUS | Status: AC
  Filled 2021-09-29: qty 20

## 2021-09-29 SURGICAL SUPPLY — 83 items
APL PRP STRL LF DISP 70% ISPRP (MISCELLANEOUS) ×1
BANDAGE ESMARK 6X9 LF (GAUZE/BANDAGES/DRESSINGS) IMPLANT
BLADE AVERAGE 25X9 (BLADE) IMPLANT
BLADE MINI RND TIP GREEN BEAV (BLADE) IMPLANT
BLADE OSC/SAG .038X5.5 CUT EDG (BLADE) IMPLANT
BLADE SURG 15 STRL LF DISP TIS (BLADE) ×2 IMPLANT
BLADE SURG 15 STRL SS (BLADE) ×2
BNDG CMPR 9X4 STRL LF SNTH (GAUZE/BANDAGES/DRESSINGS) ×1
BNDG CMPR 9X6 STRL LF SNTH (GAUZE/BANDAGES/DRESSINGS) ×1
BNDG ELASTIC 4X5.8 VLCR STR LF (GAUZE/BANDAGES/DRESSINGS) IMPLANT
BNDG ELASTIC 6X5.8 VLCR STR LF (GAUZE/BANDAGES/DRESSINGS) IMPLANT
BNDG ESMARK 4X9 LF (GAUZE/BANDAGES/DRESSINGS) IMPLANT
BNDG ESMARK 6X9 LF (GAUZE/BANDAGES/DRESSINGS) ×1
BOOT STEPPER DURA LG (SOFTGOODS) IMPLANT
BOOT STEPPER DURA MED (SOFTGOODS) IMPLANT
CANISTER SUCT 1200ML W/VALVE (MISCELLANEOUS) IMPLANT
CHLORAPREP W/TINT 26 (MISCELLANEOUS) ×1 IMPLANT
CNTNR URN SCR LID CUP LEK RST (MISCELLANEOUS) IMPLANT
CONT SPEC 4OZ STRL OR WHT (MISCELLANEOUS) ×1
COVER BACK TABLE 60X90IN (DRAPES) ×1 IMPLANT
CUFF TOURN SGL QUICK 24 (TOURNIQUET CUFF)
CUFF TOURN SGL QUICK 34 (TOURNIQUET CUFF)
CUFF TRNQT CYL 24X4X16.5-23 (TOURNIQUET CUFF) ×1 IMPLANT
CUFF TRNQT CYL 34X4.125X (TOURNIQUET CUFF) IMPLANT
DRAPE EXTREMITY T 121X128X90 (DISPOSABLE) ×1 IMPLANT
DRAPE OEC MINIVIEW 54X84 (DRAPES) IMPLANT
DRAPE SURG 17X23 STRL (DRAPES) IMPLANT
DRAPE U-SHAPE 47X51 STRL (DRAPES) ×1 IMPLANT
DRSG MEPITEL 4X7.2 (GAUZE/BANDAGES/DRESSINGS) ×1 IMPLANT
DRSG PAD ABDOMINAL 8X10 ST (GAUZE/BANDAGES/DRESSINGS) ×1 IMPLANT
ELECT REM PT RETURN 9FT ADLT (ELECTROSURGICAL) ×1
ELECTRODE REM PT RTRN 9FT ADLT (ELECTROSURGICAL) ×1 IMPLANT
GAUZE SPONGE 4X4 12PLY STRL (GAUZE/BANDAGES/DRESSINGS) ×2 IMPLANT
GLOVE BIO SURGEON STRL SZ8 (GLOVE) ×1 IMPLANT
GLOVE BIOGEL PI IND STRL 7.0 (GLOVE) IMPLANT
GLOVE BIOGEL PI IND STRL 8 (GLOVE) ×2 IMPLANT
GLOVE BIOGEL PI INDICATOR 7.0 (GLOVE) ×4
GLOVE BIOGEL PI INDICATOR 8 (GLOVE) ×1
GLOVE SURG SS PI 6.5 STRL IVOR (GLOVE) IMPLANT
GLOVE SURG SS PI 7.0 STRL IVOR (GLOVE) IMPLANT
GOWN STRL REUS W/ TWL LRG LVL3 (GOWN DISPOSABLE) ×1 IMPLANT
GOWN STRL REUS W/ TWL XL LVL3 (GOWN DISPOSABLE) ×2 IMPLANT
GOWN STRL REUS W/TWL LRG LVL3 (GOWN DISPOSABLE) ×2
GOWN STRL REUS W/TWL XL LVL3 (GOWN DISPOSABLE) ×1
K-WIRE DBL .054X9 NSTRL (WIRE)
KIT STIMULAN RAPID CURE 5CC (Orthopedic Implant) IMPLANT
KWIRE DBL .054X9 NSTRL (WIRE) IMPLANT
MANIFOLD NEPTUNE II (INSTRUMENTS) IMPLANT
NDL HYPO 25X1 1.5 SAFETY (NEEDLE) IMPLANT
NEEDLE HYPO 22GX1.5 SAFETY (NEEDLE) ×1 IMPLANT
NEEDLE HYPO 25X1 1.5 SAFETY (NEEDLE) ×1 IMPLANT
NS IRRIG 1000ML POUR BTL (IV SOLUTION) ×1 IMPLANT
PACK BASIN DAY SURGERY FS (CUSTOM PROCEDURE TRAY) ×1 IMPLANT
PAD CAST 4YDX4 CTTN HI CHSV (CAST SUPPLIES) ×1 IMPLANT
PADDING CAST COTTON 4X4 STRL (CAST SUPPLIES) ×1
PADDING CAST COTTON 6X4 STRL (CAST SUPPLIES) IMPLANT
PENCIL SMOKE EVACUATOR (MISCELLANEOUS) ×1 IMPLANT
SANITIZER HAND PURELL 535ML FO (MISCELLANEOUS) ×1 IMPLANT
SET IRRIG Y TYPE TUR BLADDER L (SET/KITS/TRAYS/PACK) IMPLANT
SHEET MEDIUM DRAPE 40X70 STRL (DRAPES) ×1 IMPLANT
SLEEVE SCD COMPRESS KNEE MED (STOCKING) ×1 IMPLANT
SPLINT FAST PLASTER 5X30 (CAST SUPPLIES)
SPLINT PLASTER CAST FAST 5X30 (CAST SUPPLIES) IMPLANT
SPONGE SURGIFOAM ABS GEL 12-7 (HEMOSTASIS) IMPLANT
SPONGE T-LAP 18X18 ~~LOC~~+RFID (SPONGE) ×1 IMPLANT
STOCKINETTE 6  STRL (DRAPES) ×1
STOCKINETTE 6 STRL (DRAPES) ×1 IMPLANT
SUCTION FRAZIER HANDLE 10FR (MISCELLANEOUS) ×1
SUCTION TUBE FRAZIER 10FR DISP (MISCELLANEOUS) IMPLANT
SUT ETHILON 3 0 PS 1 (SUTURE) ×1 IMPLANT
SUT MNCRL AB 3-0 PS2 18 (SUTURE) ×1 IMPLANT
SUT PDS AB 2-0 CT2 27 (SUTURE) IMPLANT
SUT VIC AB 2-0 SH 27 (SUTURE)
SUT VIC AB 2-0 SH 27XBRD (SUTURE) IMPLANT
SUT VICRYL 0 SH 27 (SUTURE) IMPLANT
SWAB COLLECTION DEVICE MRSA (MISCELLANEOUS) IMPLANT
SWAB CULTURE ESWAB REG 1ML (MISCELLANEOUS) IMPLANT
SYR BULB EAR ULCER 3OZ GRN STR (SYRINGE) ×1 IMPLANT
SYR CONTROL 10ML LL (SYRINGE) IMPLANT
TOWEL GREEN STERILE FF (TOWEL DISPOSABLE) ×1 IMPLANT
TUBE CONNECTING 20X1/4 (TUBING) IMPLANT
UNDERPAD 30X36 HEAVY ABSORB (UNDERPADS AND DIAPERS) ×1 IMPLANT
YANKAUER SUCT BULB TIP NO VENT (SUCTIONS) IMPLANT

## 2021-09-29 NOTE — Discharge Instructions (Addendum)
Toni Arthurs, MD [facility]  Please read the following information regarding your care after surgery.  Medications  You only need a prescription for the narcotic pain medicine (ex. oxycodone, Percocet, Norco).  All of the other medicines listed below are available over the counter. X  Aleve 2 pills twice a day for the first 3 days after surgery. X hydrocodone as prescribed for severe pain  Narcotic pain medicine (ex. oxycodone, Percocet, Vicodin) will cause constipation.  To prevent this problem, take the following medicines while you are taking any pain medicine. X docusate sodium (Colace) 100 mg twice a day X senna (Senokot) 2 tablets twice a day  Weight Bearing X Bear weight only on your operated foot in the post-op shoe.  Cast / Splint / Dressing X Keep your splint, cast or dressing clean and dry.  Don't put anything (coat hanger, pencil, etc) down inside of it.  If it gets damp, use a hair dryer on the cool setting to dry it.  If it gets soaked, call the office to schedule an appointment for a cast change.  After your dressing, cast or splint is removed; you may shower, but do not soak or scrub the wound.  Allow the water to run over it, and then gently pat it dry.  Swelling It is normal for you to have swelling where you had surgery.  To reduce swelling and pain, keep your toes above your nose for at least 3 days after surgery.  It may be necessary to keep your foot or leg elevated for several weeks.  If it hurts, it should be elevated.  Follow Up Call my office at [phone] when you are discharged from the hospital or surgery center to schedule an appointment to be seen two weeks after surgery.  Call my office at [phone] if you develop a fever >101.5 F, nausea, vomiting, bleeding from the surgical site or severe pain.       Post Anesthesia Home Care Instructions  Activity: Get plenty of rest for the remainder of the day. A responsible individual must stay with you  for 24 hours following the procedure.  For the next 24 hours, DO NOT: -Drive a car -Advertising copywriter -Drink alcoholic beverages -Take any medication unless instructed by your physician -Make any legal decisions or sign important papers.  Meals: Start with liquid foods such as gelatin or soup. Progress to regular foods as tolerated. Avoid greasy, spicy, heavy foods. If nausea and/or vomiting occur, drink only clear liquids until the nausea and/or vomiting subsides. Call your physician if vomiting continues.  Special Instructions/Symptoms: Your throat may feel dry or sore from the anesthesia or the breathing tube placed in your throat during surgery. If this causes discomfort, gargle with warm salt water. The discomfort should disappear within 24 hours.

## 2021-09-29 NOTE — Op Note (Signed)
09/29/2021  2:08 PM  PATIENT:  [name]  71 y.o. male  PRE-OPERATIVE DIAGNOSIS: Right foot fifth metatarsal base osteomyelitis  POST-OPERATIVE DIAGNOSIS: Same  Procedure(s):  Open biopsy of right 5th metatarsal  base  SURGEON:  Toni Arthurs, MD  ASSISTANT: None  ANESTHESIA:   General, local   EBL:  minimal   TOURNIQUET:   Total Tourniquet Time Documented: Calf (Right) - 22 minutes Total: Calf (Right) - 22 minutes  COMPLICATIONS:  None apparent  DISPOSITION:  Extubated, awake and stable to recovery.  INDICATION FOR PROCEDURE: 71 year old male without significant past medical history stepped on a nail a few weeks ago.  The nail penetrated the bone at the fifth metatarsal base.  He has had a couple of recurrent episodes of swelling, pain and erythema at the fifth metatarsal base.  Plain films and an MRI indicate osteomyelitis at the fifth metatarsal base but no abscess.  He has been off of antibiotics for the last week.  He presents now for excisional biopsy of the bone at the fifth metatarsal base.  After pre operative consent was obtained, and the correct operative site was identified, the patient was brought to the operating room and placed supine on the OR table.  Anesthesia was administered.  Pre-operative antibiotics were administered.  A surgical timeout was taken.   PROCEDURE IN DETAIL: After preoperative consent was obtained and the correct operative site was identified, the patient was brought the operating room and placed upon the operating table.  General anesthesia was administered.  Perioperative antibiotics were held pending cultures.  A surgical timeout was taken.  The right lower extremity was prepped and draped in standard sterile fashion.  The foot was exsanguinated and a 4 inch Esmarch tourniquet wrapped around the ankle.  An incision was then made over the fifth metatarsal base.  Dissection was carried sharply down through the subcutaneous tissues to the  periosteum.  The periosteum was incised and elevated dorsally and plantarly exposing the fifth metatarsal base.  A small curette was used to drill into the metatarsal base.  This hole was expanded with larger curettes until a small window was formed.  Curettes were then used to remove the medullary bone.  Specimens were sent to pathology and to microbiology.  The cortical bone generally appeared intact but was somewhat soft near the insertion of the peroneus brevis.  This corresponded to the area of osteomyelitis seen on MRI and x-ray.  The wound was then irrigated copiously with 3 L of normal saline.  Stimulan beads with gentamicin and vancomycin were packed into the medullary canal.  The periosteum and deep subcutaneous tissues were approximated with inverted simple sutures of 2-0 PDS.  The skin incision was closed with horizontal mattress sutures of 3-0 nylon.  Sterile dressings were applied followed by a well-padded compression dressing.  Antibiotics were administered after cultures were obtained.  The tourniquet was released and the patient was extubated and transported to the recovery room in stable condition.   FOLLOW UP PLAN: Weightbearing as tolerated in a flat postop shoe on the right foot.  Follow-up in 2 weeks for suture removal.  Follow-up in 1 week with infectious disease for follow-up of cultures.  Oral Augmentin and doxycycline per Dr. Thedore Mins.

## 2021-09-29 NOTE — Anesthesia Procedure Notes (Signed)
Procedure Name: LMA Insertion Date/Time: 09/29/2021 1:26 PM  Performed by: Marny Lowenstein, CRNAPre-anesthesia Checklist: Patient identified, Emergency Drugs available, Suction available and Patient being monitored Patient Re-evaluated:Patient Re-evaluated prior to induction Oxygen Delivery Method: Circle system utilized Preoxygenation: Pre-oxygenation with 100% oxygen Induction Type: IV induction Ventilation: Mask ventilation with difficulty and Oral airway inserted - appropriate to patient size LMA: LMA inserted LMA Size: 4.0 Number of attempts: 1 Placement Confirmation: positive ETCO2 and breath sounds checked- equal and bilateral Tube secured with: Tape Dental Injury: Teeth and Oropharynx as per pre-operative assessment

## 2021-09-29 NOTE — Anesthesia Preprocedure Evaluation (Addendum)
Anesthesia Evaluation  Patient identified by MRN, date of birth, ID band Patient awake    Reviewed: Allergy & Precautions, NPO status , Patient's Chart, lab work & pertinent test results  Airway Mallampati: II  TM Distance: >3 FB Neck ROM: Full    Dental no notable dental hx.    Pulmonary asthma , COPD, former smoker,    Pulmonary exam normal        Cardiovascular negative cardio ROS   Rhythm:Regular Rate:Normal     Neuro/Psych Anxiety Depression negative neurological ROS     GI/Hepatic negative GI ROS, Neg liver ROS,   Endo/Other  negative endocrine ROS  Renal/GU negative Renal ROS  negative genitourinary   Musculoskeletal  (+) Arthritis , Osteoarthritis,    Abdominal Normal abdominal exam  (+)   Peds  Hematology negative hematology ROS (+)   Anesthesia Other Findings   Reproductive/Obstetrics                            Anesthesia Physical Anesthesia Plan  ASA: 3  Anesthesia Plan: General   Post-op Pain Management:    Induction: Intravenous  PONV Risk Score and Plan: 2 and Ondansetron, Dexamethasone and Treatment may vary due to age or medical condition  Airway Management Planned: Mask and LMA  Additional Equipment: None  Intra-op Plan:   Post-operative Plan: Extubation in OR  Informed Consent: I have reviewed the patients History and Physical, chart, labs and discussed the procedure including the risks, benefits and alternatives for the proposed anesthesia with the patient or authorized representative who has indicated his/her understanding and acceptance.     Dental advisory given  Plan Discussed with:   Anesthesia Plan Comments:        Anesthesia Quick Evaluation

## 2021-09-29 NOTE — Telephone Encounter (Signed)
Called by Dr. Delia Chimes) per PO antibiotic recc for amb surgery. Pt off of antibiotics x 4 days prior to OR debridement. Recommended Doxy and augmentin.  Triage: could we get pt seen in a week or so?  Thank you

## 2021-09-29 NOTE — Transfer of Care (Signed)
Immediate Anesthesia Transfer of Care Note  Patient: [name]  Procedure(s) Performed: Open biopsy of right 5th metatarsal  base (Right: Foot)  Patient Location: PACU  Anesthesia Type:General  Level of Consciousness: awake and patient cooperative  Airway & Oxygen Therapy: Patient Spontanous Breathing  Post-op Assessment: Report given to RN and Post -op Vital signs reviewed and stable  Post vital signs: Reviewed and stable  Last Vitals:  Vitals Value Taken Time  BP 134/82 09/29/21 1416  Temp 36.6 C 09/29/21 1415  Pulse 66 09/29/21 1420  Resp 16 09/29/21 1420  SpO2 94 % 09/29/21 1420  Vitals shown include unvalidated device data.  Last Pain:  Vitals:   09/29/21 1415  TempSrc:   PainSc: 0-No pain      Patients Stated Pain Goal: 5 (09/29/21 1138)  Complications: No notable events documented.

## 2021-09-29 NOTE — H&P (Signed)
[name] is an 71 y.o. male.   Chief Complaint: Right foot pain HPI: 71 year old male without significant past medical history stepped on a nail several weeks ago.  The nail penetrated the fifth metatarsal base.  He had a couple of rounds of oral antibiotics.  He has been off of antibiotics now for over a week.  He presents for irrigation and debridement of the fifth metatarsal base with application of calcium sulfate beads and antibiotics.  Plan is for referral to infectious disease to follow-up cultures and treat with antibiotics as indicated.  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 His smoking use included cigarettes. He has a 10.00 pack-year smoking history. He has quit using smokeless tobacco. He reports current alcohol use. He reports that he does not use drugs.  Allergies: No Known Allergies  Medications Prior to Admission  Medication Sig Dispense Refill   budesonide-formoterol (SYMBICORT) 160-4.5 MCG/ACT inhaler Inhale 2 puffs into the lungs 2 (two) times daily. 3 each 3   naproxen (NAPROSYN) 375 MG tablet Take 1 tablet (375 mg total) by mouth 2 (two) times daily. Take with food 20 tablet 0   PARoxetine  (PAXIL) 20 MG tablet Take 20 mg by mouth daily.   0   PROAIR HFA 108 (90 Base) MCG/ACT inhaler INHALE 2 PUFFS INTO THE LUNGS EVERY 4 HOURS AS NEEDED FOR WHEEZING OR SHORTNESS OF BREATH 8.5 g 0   sildenafil (REVATIO) 20 MG tablet Take 1 tablet (20 mg total) by mouth daily as needed (Erectile dysfunction). 10 tablet 1   ketoconazole 2%-triamcinolone 0.1% 1:2 cream mixture Apply topically daily. 45 g 1    No results found for this or any previous visit (from the past 48 hour(s)). No results found.  Review of Systems no recent fever, chills, nausea, vomiting or changes in his appetite.  Positive swelling and tenderness at the fifth metatarsal base of his right foot.  Blood pressure (!) 141/108, pulse (!) 58, temperature 98.3 F (36.8 C), temperature source Oral, resp. rate 18, height 6\' 2"  (1.88 m), weight 106.8 kg, SpO2 98 %. Physical Exam  Well-nourished well-developed man in no apparent distress.  Alert and oriented x4.  Normal mood and affect.  Gait is normal.  The right foot is swollen and tender over the fifth metatarsal base.  No erythema or wound is evident.  Pulses are palpable in the foot.  Intact sensibility to light touch in the sural nerve distribution.   Assessment/Plan Right fifth metatarsal  base penetrating wound complicated by osteomyelitis -to the operating room today for I&D and antibiotic beads.  The risks and benefits of the alternative treatment options have been discussed in detail.  The patient wishes to proceed with surgery and specifically understands risks of bleeding, infection, nerve damage, blood clots, need for additional surgery, amputation and death.   Toni Arthurs, MD 10-15-2021, 12:42 PM

## 2021-09-29 NOTE — Anesthesia Postprocedure Evaluation (Signed)
Anesthesia Post Note  Patient: JAIME GRIZZELL  Procedure(s) Performed: Open biopsy of right 5th metatarsal  base (Right: Foot)     Patient location during evaluation: PACU Anesthesia Type: General Level of consciousness: awake and alert Pain management: pain level controlled Vital Signs Assessment: post-procedure vital signs reviewed and stable Respiratory status: spontaneous breathing, nonlabored ventilation, respiratory function stable and patient connected to nasal cannula oxygen Cardiovascular status: blood pressure returned to baseline and stable Postop Assessment: no apparent nausea or vomiting Anesthetic complications: no   No notable events documented.  Last Vitals:  Vitals:   09/29/21 1415 09/29/21 1435  BP: 134/82 (!) 140/76  Pulse: 67 69  Resp: 13 16  Temp: 36.6 C (!) 36.3 C  SpO2: 99% 94%    Last Pain:  Vitals:   09/29/21 1435  TempSrc:   PainSc: 0-No pain                 Earl Lites P Jenet Durio

## 2021-09-30 ENCOUNTER — Encounter (HOSPITAL_BASED_OUTPATIENT_CLINIC_OR_DEPARTMENT_OTHER): Payer: Self-pay | Admitting: Orthopedic Surgery

## 2021-09-30 LAB — SURGICAL PATHOLOGY

## 2021-10-04 LAB — AEROBIC/ANAEROBIC CULTURE W GRAM STAIN (SURGICAL/DEEP WOUND)
Culture: NO GROWTH
Gram Stain: NONE SEEN

## 2021-10-06 ENCOUNTER — Encounter: Payer: Self-pay | Admitting: Internal Medicine

## 2021-10-06 ENCOUNTER — Other Ambulatory Visit: Payer: Self-pay

## 2021-10-06 ENCOUNTER — Ambulatory Visit: Payer: PPO | Admitting: Internal Medicine

## 2021-10-06 VITALS — BP 136/85 | HR 57 | Temp 97.6°F | Ht 74.0 in | Wt 240.0 lb

## 2021-10-06 DIAGNOSIS — M869 Osteomyelitis, unspecified: Secondary | ICD-10-CM | POA: Diagnosis not present

## 2021-10-06 MED ORDER — DOXYCYCLINE HYCLATE 100 MG PO TABS: 100.0000 mg | ORAL_TABLET | Freq: Two times a day (BID) | ORAL | 2 refills | Status: DC

## 2021-10-06 MED ORDER — AMOXICILLIN-POT CLAVULANATE 875-125 MG PO TABS: 1.0000 | ORAL_TABLET | Freq: Two times a day (BID) | ORAL | 2 refills | Status: DC

## 2021-10-06 NOTE — Progress Notes (Signed)
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 M with anxiety/depression, COPD, hyperlipidemia presents for management of right foot fifth metatarsal base osteomyelitis.  He underwent open biopsy of the right fifth metatarsal base on 8/24, pathology showed slight inflammation c/w osteomyelitis, cultures with no growth.  A few weeks prior to the OR on 8/24 patient is stepped on a nail, the nail had penetrated the fifth metatarsal base.  He had a couple recurrent episodes of swelling, pain and erythema at the fifth metatarsal base.  He had been on a couple of rounds of antibiotics prior to the OR, last dose of antibiotics will be more than a week from the OR biopsy. On 8/11 MRI showed early acute osteomyelitis of the fifth metatarsal base. Today 8/31: Patient reports he is tolerating antibiotics.  Denies fever, chills, nausea vomiting diarrhea.  Reports foot pain has improved. Review of Systems  All other systems reviewed and are negative.   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 21 tablet 0   budesonide-formoterol (SYMBICORT) 160-4.5 MCG/ACT inhaler Inhale 2 puffs into the lungs 2 (two) times daily. 3 each 3   doxycycline (VIBRA-TABS) 100 MG tablet Take 1 tablet (100 mg total) by mouth 2 (two) times daily for 7 days. 14 tablet 0   ketoconazole 2%-triamcinolone 0.1% 1:2 cream mixture Apply topically daily. 45 g 1   naproxen (NAPROSYN) 375 MG tablet Take 1 tablet (375 mg total) by mouth 2 (two) times daily. Take with food 20 tablet 0   PARoxetine (PAXIL) 20 MG tablet Take 20 mg by mouth daily.   0   PROAIR HFA 108 (90 Base) MCG/ACT inhaler INHALE 2 PUFFS INTO THE LUNGS EVERY 4 HOURS AS NEEDED FOR WHEEZING OR SHORTNESS OF BREATH 8.5 g 0   sildenafil (REVATIO) 20 MG tablet Take 1 tablet (20 mg total) by mouth daily as needed (Erectile dysfunction). 10 tablet 1   No facility-administered medications prior to visit.     No Known Allergies  Social History   Tobacco Use   Smoking status: Former    Packs/day: 1.00    Years: 10.00    Total pack years: 10.00    Types: Cigarettes    Quit date: 02/06/1984    Years since quitting: 37.6   Smokeless tobacco: Former  [employer]  Vaping Use: Never used  Substance Use Topics   Alcohol use: Yes    Comment: occasionally   Drug use: No    Family History  Problem Relation Age of Onset   Diabetes Mother    Heart disease Mother    Heart disease Father        old age at 60   Heart disease Brother 98       cabg   Early death Son 96       MVA    Objective:   Vitals:   10/06/21 1104  BP: 136/85  Pulse: (!) 57  Temp: 97.6 F (36.4 C)  TempSrc: Oral  Weight: 240 lb (108.9 kg)  Height: 6' 2" (1.88 m)   Body mass index is 30.81 kg/m.  Physical Exam Constitutional:      General: He is not in acute  distress.    Appearance: He is normal weight. He is not toxic-appearing.  HENT:     Head: Normocephalic and atraumatic.     Right Ear: External ear normal.     Left Ear: External ear normal.     Nose: No congestion or rhinorrhea.     Mouth/Throat:     Mouth: Mucous membranes are moist.     Pharynx: Oropharynx is clear.  Eyes:     Extraocular Movements: Extraocular movements intact.     Conjunctiva/sclera: Conjunctivae normal.     Pupils: Pupils are equal, round, and reactive to light.  Cardiovascular:     Rate and Rhythm: Normal rate and regular rhythm.     Heart sounds: No murmur heard.    No friction rub. No gallop.  Pulmonary:     Effort: Pulmonary effort is normal.     Breath sounds: Normal breath sounds.  Abdominal:     General: Abdomen is flat. Bowel sounds are normal.     Palpations: Abdomen is soft.  Musculoskeletal:        General: No swelling. Normal range of motion.     Cervical back: Normal range of motion and neck supple.  Skin:    General: Skin is warm and dry.  Neurological:     General: No focal deficit present.     Mental Status: He is oriented to person, place, and time.  Psychiatric:        Mood and Affect: Mood normal.     Lab Results: Lab Results  Component Value Date   WBC 8.6 09/16/2021   HGB 13.9 09/16/2021   HCT 41.4 09/16/2021   MCV 89.8 09/16/2021   PLT 239 09/16/2021    Lab Results  Component Value Date   CREATININE 1.02 09/16/2021   BUN 17 09/16/2021   NA 136 09/16/2021   K 4.6 09/16/2021   CL 106 09/16/2021   CO2 27 09/16/2021    Lab Results  Component Value Date   ALT 16 06/30/2020   AST 13 06/30/2020   ALKPHOS 98 06/30/2020   BILITOT 0.2 06/30/2020     Assessment & Plan:  #Right foot 5th metatarsal base osteomyelitis -Patient stepped on nail few weeks prior to the OR.  He had developed swelling and tenderness has been a couple rounds of antibiotics which were stopped more than 7 days from open biopsy on 8/24. -On 8/11  MRI showed acute osteo of 5th metatarsal base -OR pathology on 8/24 showed slight inflammation consistent with osteomyelitis, or cultures are negative.  ID was called and he was started on Augmentin and doxycycline. -Patient reports he  is tolerating antibiotics well.  Denies fever chills nausea vomiting diarrhea.  No concern for skin soft tissue infection noted at today's visit.  Plan: -Pt is currently taking augmetin 500 bid, changed to 875/125 mg bid, conitnue doxycyline 100 mg PO bid -Anticipate 6 weeks of antibiotics EOT 10/5 -Labs today cbc , cmp esr , crp -Follow-up on 10/5 at end of treatment, consider imaging at that time.  Laurice Record, MD [facility]   10/06/21  11:08 AM

## 2021-10-06 NOTE — Patient Instructions (Addendum)
End antibiotics on 10/5

## 2021-10-07 LAB — COMPLETE METABOLIC PANEL WITH GFR
AG Ratio: 1.7 (calc) (ref 1.0–2.5)
ALT: 15 U/L (ref 9–46)
AST: 13 U/L (ref 10–35)
Albumin: 4 g/dL (ref 3.6–5.1)
Alkaline phosphatase (APISO): 81 U/L (ref 35–144)
BUN: 16 mg/dL (ref 7–25)
CO2: 25 mmol/L (ref 20–32)
Calcium: 8 mg/dL — ABNORMAL LOW (ref 8.6–10.3)
Chloride: 108 mmol/L (ref 98–110)
Creat: 1.12 mg/dL (ref 0.70–1.28)
Globulin: 2.3 g/dL (calc) (ref 1.9–3.7)
Glucose, Bld: 83 mg/dL (ref 65–99)
Potassium: 4.4 mmol/L (ref 3.5–5.3)
Sodium: 140 mmol/L (ref 135–146)
Total Bilirubin: 0.5 mg/dL (ref 0.2–1.2)
Total Protein: 6.3 g/dL (ref 6.1–8.1)
eGFR: 71 mL/min/{1.73_m2} (ref >=60)

## 2021-10-07 LAB — CBC WITH DIFFERENTIAL/PLATELET
Absolute Monocytes: 698 cells/uL (ref 200–950)
Basophils Absolute: 8 cells/uL (ref 0–200)
Basophils Relative: 0.1 %
Eosinophils Absolute: 435 cells/uL (ref 15–500)
Eosinophils Relative: 5.8 %
HCT: 39.9 % (ref 38.5–50.0)
Hemoglobin: 13.9 g/dL (ref 13.2–17.1)
Lymphs Abs: 1830 cells/uL (ref 850–3900)
MCH: 30.1 pg (ref 27.0–33.0)
MCHC: 34.8 g/dL (ref 32.0–36.0)
MCV: 86.4 fL (ref 80.0–100.0)
MPV: 9.2 fL (ref 7.5–12.5)
Monocytes Relative: 9.3 %
Neutro Abs: 4530 cells/uL (ref 1500–7800)
Neutrophils Relative %: 60.4 %
Platelets: 230 10*3/uL (ref 140–400)
RBC: 4.62 10*6/uL (ref 4.20–5.80)
RDW: 13 % (ref 11.0–15.0)
Total Lymphocyte: 24.4 %
WBC: 7.5 10*3/uL (ref 3.8–10.8)

## 2021-10-07 LAB — C-REACTIVE PROTEIN: CRP: 6.1 mg/L (ref <8.0)

## 2021-10-07 LAB — SEDIMENTATION RATE: Sed Rate: 9 mm/h (ref 0–20)

## 2021-11-22 ENCOUNTER — Ambulatory Visit: Payer: PPO | Admitting: Internal Medicine

## 2021-11-22 ENCOUNTER — Encounter: Payer: Self-pay | Admitting: Internal Medicine

## 2021-11-22 ENCOUNTER — Other Ambulatory Visit: Payer: Self-pay

## 2021-11-22 VITALS — BP 135/92 | HR 61 | Temp 97.9°F | Wt 246.0 lb

## 2021-11-22 DIAGNOSIS — M869 Osteomyelitis, unspecified: Secondary | ICD-10-CM | POA: Diagnosis not present

## 2021-11-22 NOTE — Progress Notes (Signed)
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   AMOXICILLIN-CLAVULANATE (AUGMENTIN) 875-125 MG TABLET    Take 1 tablet by mouth 2 (two) times daily.   BUDESONIDE-FORMOTEROL (SYMBICORT) 160-4.5 MCG/ACT INHALER    Inhale 2 puffs into the lungs 2 (two) times daily.   DOXYCYCLINE (VIBRA-TABS) 100 MG TABLET    Take 1 tablet (100 mg total) by mouth 2 (two) times daily with a meal.   KETOCONAZOLE 2%-TRIAMCINOLONE 0.1% 1:2 CREAM MIXTURE    Apply topically daily.   NAPROXEN (NAPROSYN) 375 MG TABLET    Take 1 tablet (375 mg total) by mouth 2 (two) times daily. Take with food   PAROXETINE (PAXIL) 20 MG TABLET    Take 20 mg by mouth daily.    PROAIR HFA 108 (90 BASE) MCG/ACT INHALER    INHALE 2 PUFFS INTO THE LUNGS EVERY 4 HOURS AS NEEDED FOR WHEEZING OR SHORTNESS OF BREATH   SILDENAFIL (REVATIO) 20 MG TABLET    Take 1 tablet (20 mg total) by mouth daily as needed (Erectile dysfunction).  Modified Medications   No medications on file  Discontinued Medications   No medications on file    Subjective: [name] is a 71 y.o. M with anxiety/depression, COPD, hyperlipidemia presents for management of right foot fifth metatarsal base osteomyelitis.  He underwent open biopsy of the right fifth metatarsal base on 8/24, pathology showed slight inflammation c/w osteomyelitis,  cultures with no growth.  A few weeks prior to the OR on 8/24 patient is stepped on a nail, the nail had penetrated the fifth metatarsal base.  He had a couple recurrent episodes of swelling, pain and erythema at the fifth metatarsal base.  He had been on a couple of rounds of antibiotics prior to the OR, last dose of antibiotics will be more than a week from the OR biopsy. On 8/11 MRI showed early acute osteomyelitis of the fifth metatarsal base. Today 8/31: Patient reports he is tolerating antibiotics.  Denies fever, chills, nausea vomiting diarrhea.  Reports foot pain has improved.   Review of Systems: Review of Systems  All other systems reviewed and are negative.   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   Hyperlipemia 11/23/2016    Social History   Tobacco Use   Smoking status: Former    Packs/day: 1.00    Years: 10.00    Total pack years: 10.00    Types: Cigarettes    Quit date: 02/06/1984    Years since quitting: 37.8   Smokeless tobacco: Former  Scientific laboratory technician Use: Never used  Substance Use Topics   Alcohol use: Yes    Comment: occasionally   Drug use: No    Family History  Problem Relation Age of Onset   Diabetes Mother    Heart disease Mother    Heart disease Father        old age at 10   Heart disease Brother 55       cabg   Early death Son 62       MVA    No Known Allergies  Health Maintenance  Topic Date Due   COVID-19 Vaccine (1) Never done   Zoster Vaccines- Shingrix (1 of 2) Never done   Pneumonia Vaccine 44+ Years old (1 - PCV) Never done   INFLUENZA VACCINE  09/06/2021   TETANUS/TDAP  11/01/2026   COLONOSCOPY (Pts 45-17yr Insurance coverage will need to be confirmed)  11/22/2026   Hepatitis C Screening  Completed   HPV VACCINES   Aged Out    Objective:  Vitals:   11/22/21 0933  BP: (!) 135/92  Pulse: 61  Temp: 97.9 F (36.6 C)  TempSrc: Oral  SpO2: 95%  Weight: 246 lb (111.6 kg)   Body mass index is 31.58 kg/m.  Physical Exam Constitutional:      General: He is not in acute distress.    Appearance: He is normal weight. He is not toxic-appearing.  HENT:     Head: Normocephalic and atraumatic.     Right Ear: External ear normal.     Left Ear: External ear normal.     Nose: No congestion or rhinorrhea.     Mouth/Throat:     Mouth: Mucous membranes are moist.     Pharynx: Oropharynx is clear.  Eyes:     Extraocular Movements: Extraocular movements intact.     Conjunctiva/sclera: Conjunctivae normal.     Pupils: Pupils are equal, round, and reactive to light.  Cardiovascular:     Rate and Rhythm: Normal rate and regular rhythm.     Heart sounds: No murmur heard.    No friction rub. No gallop.  Pulmonary:     Effort: Pulmonary effort is normal.     Breath sounds: Normal breath sounds.  Abdominal:     General: Abdomen is flat. Bowel sounds are normal.     Palpations: Abdomen is soft.  Musculoskeletal:        General: No swelling. Normal range of motion.     Cervical back: Normal range of motion and neck supple.  Skin:    General: Skin is warm and dry.  Neurological:     General: No focal deficit present.     Mental Status: He is oriented to person, place, and time.  Psychiatric:        Mood and Affect: Mood normal.      Lab Results Lab Results  Component Value Date   WBC 7.5 10/06/2021   HGB 13.9 10/06/2021   HCT 39.9 10/06/2021   MCV 86.4 10/06/2021   PLT 230 10/06/2021    Lab Results  Component Value Date   CREATININE 1.12 10/06/2021   BUN 16 10/06/2021   NA 140 10/06/2021   K 4.4 10/06/2021   CL 108 10/06/2021   CO2 25 10/06/2021    Lab Results  Component Value Date   ALT 15 10/06/2021   AST 13 10/06/2021   ALKPHOS 98 06/30/2020   BILITOT 0.5 10/06/2021    Lab  Results  Component Value Date   CHOL 193 06/30/2020  HDL 44 06/30/2020   LDLCALC 131 (H) 06/30/2020   TRIG 100 06/30/2020   No results found for: "LABRPR", "RPRTITER" No results found for: "HIV1RNAQUANT", "HIV1RNAVL", "CD4TABS"   Assessment & Plan:  #Right foot 5th metatarsal base osteomyelitis -Patient stepped on nail few weeks prior to the OR.  He had developed swelling and tenderness has been a couple rounds of antibiotics which were stopped more than 7 days from open biopsy on 8/24. -On 8/11 MRI showed acute osteo of 5th metatarsal base -OR pathology on 8/24 showed slight inflammation consistent with osteomyelitis, or cultures are negative.  ID was called and he was started on Augmentin and doxycycline EOT 10/5. On 8/31 visit Augmentin dosing changed from 500n bid to 875/117m PO bid.  -Wound looks well healed, he continues on antibiotics   Plan: -Stop antibiotics(completed >6 weeks): Augmentin and doxy -MRI right foot  -Labs today cbc , cmp esr , crp -Follow up in one month to review imaging     MLaurice Record MPl[facility]for [facility]ious [facility] 11/22/2021, 9:47 AM

## 2021-11-23 LAB — C-REACTIVE PROTEIN: CRP: 4.9 mg/L (ref <8.0)

## 2021-11-23 LAB — COMPLETE METABOLIC PANEL WITH GFR
AG Ratio: 1.8 (calc) (ref 1.0–2.5)
ALT: 19 U/L (ref 9–46)
AST: 18 U/L (ref 10–35)
Albumin: 3.9 g/dL (ref 3.6–5.1)
Alkaline phosphatase (APISO): 78 U/L (ref 35–144)
BUN: 12 mg/dL (ref 7–25)
CO2: 28 mmol/L (ref 20–32)
Calcium: 8.7 mg/dL (ref 8.6–10.3)
Chloride: 106 mmol/L (ref 98–110)
Creat: 1.05 mg/dL (ref 0.70–1.28)
Globulin: 2.2 g/dL (calc) (ref 1.9–3.7)
Glucose, Bld: 117 mg/dL — ABNORMAL HIGH (ref 65–99)
Potassium: 4.7 mmol/L (ref 3.5–5.3)
Sodium: 139 mmol/L (ref 135–146)
Total Bilirubin: 0.5 mg/dL (ref 0.2–1.2)
Total Protein: 6.1 g/dL (ref 6.1–8.1)
eGFR: 76 mL/min/{1.73_m2} (ref >=60)

## 2021-11-23 LAB — CBC WITH DIFFERENTIAL/PLATELET
Absolute Monocytes: 498 cells/uL (ref 200–950)
Basophils Absolute: 39 cells/uL (ref 0–200)
Basophils Relative: 0.7 %
Eosinophils Absolute: 498 cells/uL (ref 15–500)
Eosinophils Relative: 8.9 %
HCT: 42.2 % (ref 38.5–50.0)
Hemoglobin: 14.1 g/dL (ref 13.2–17.1)
Lymphs Abs: 1170 cells/uL (ref 850–3900)
MCH: 29.7 pg (ref 27.0–33.0)
MCHC: 33.4 g/dL (ref 32.0–36.0)
MCV: 88.8 fL (ref 80.0–100.0)
MPV: 9.3 fL (ref 7.5–12.5)
Monocytes Relative: 8.9 %
Neutro Abs: 3394 cells/uL (ref 1500–7800)
Neutrophils Relative %: 60.6 %
Platelets: 227 10*3/uL (ref 140–400)
RBC: 4.75 10*6/uL (ref 4.20–5.80)
RDW: 13.1 % (ref 11.0–15.0)
Total Lymphocyte: 20.9 %
WBC: 5.6 10*3/uL (ref 3.8–10.8)

## 2021-11-23 LAB — SEDIMENTATION RATE: Sed Rate: 2 mm/h (ref 0–20)

## 2021-12-13 ENCOUNTER — Ambulatory Visit (HOSPITAL_COMMUNITY): Admission: RE | Admit: 2021-12-13 | Payer: PPO | Source: Ambulatory Visit

## 2021-12-13 ENCOUNTER — Other Ambulatory Visit: Payer: Self-pay | Admitting: Internal Medicine

## 2021-12-13 DIAGNOSIS — M869 Osteomyelitis, unspecified: Secondary | ICD-10-CM

## 2021-12-15 DIAGNOSIS — A691 Other Vincent's infections: Secondary | ICD-10-CM | POA: Diagnosis not present

## 2021-12-15 DIAGNOSIS — R070 Pain in throat: Secondary | ICD-10-CM | POA: Diagnosis not present

## 2022-01-02 ENCOUNTER — Ambulatory Visit: Payer: PPO | Admitting: Internal Medicine

## 2022-01-09 ENCOUNTER — Other Ambulatory Visit: Payer: Self-pay

## 2022-01-09 ENCOUNTER — Ambulatory Visit (INDEPENDENT_AMBULATORY_CARE_PROVIDER_SITE_OTHER): Payer: PPO | Admitting: Internal Medicine

## 2022-01-09 ENCOUNTER — Encounter: Payer: Self-pay | Admitting: Internal Medicine

## 2022-01-09 VITALS — BP 139/91 | HR 60 | Temp 97.4°F | Ht 74.0 in | Wt 242.0 lb

## 2022-01-09 DIAGNOSIS — M86271 Subacute osteomyelitis, right ankle and foot: Secondary | ICD-10-CM | POA: Diagnosis not present

## 2022-01-09 NOTE — Progress Notes (Signed)
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   AMOXICILLIN-CLAVULANATE (AUGMENTIN) 875-125 MG TABLET    Take 1 tablet by mouth 2 (two) times daily.   BUDESONIDE-FORMOTEROL (SYMBICORT) 160-4.5 MCG/ACT INHALER    Inhale 2 puffs into the lungs 2 (two) times daily.   DOXYCYCLINE (VIBRA-TABS) 100 MG TABLET    Take 1 tablet (100 mg total) by mouth 2 (two) times daily with a meal.   KETOCONAZOLE 2%-TRIAMCINOLONE 0.1% 1:2 CREAM MIXTURE    Apply topically daily.   NAPROXEN (NAPROSYN) 375 MG TABLET    Take 1 tablet (375 mg total) by mouth 2 (two) times daily. Take with food   PAROXETINE (PAXIL) 20 MG TABLET    Take 20 mg by mouth daily.    PROAIR HFA 108 (90 BASE) MCG/ACT INHALER    INHALE 2 PUFFS INTO THE LUNGS EVERY 4 HOURS AS NEEDED FOR WHEEZING OR SHORTNESS OF BREATH   SILDENAFIL (REVATIO) 20 MG TABLET    Take 1 tablet (20 mg total) by mouth daily as needed (Erectile dysfunction).  Modified Medications   No medications on file  Discontinued Medications   No medications on file    Subjective: [name] is a 71 y.o. M with anxiety/depression, COPD, hyperlipidemia presents for management of right foot fifth metatarsal base osteomyelitis.  He underwent open biopsy of the right fifth metatarsal base on 8/24, pathology showed slight inflammation c/w  osteomyelitis, cultures with no growth.  A few weeks prior to the OR on 8/24 patient is stepped on a nail, the nail had penetrated the fifth metatarsal base.  He had a couple recurrent episodes of swelling, pain and erythema at the fifth metatarsal base.  He had been on a couple of rounds of antibiotics prior to the OR, last dose of antibiotics will be more than a week from the OR biopsy. On 8/11 MRI showed early acute osteomyelitis of the fifth metatarsal base. 8/31: Patient reports he is tolerating antibiotics.  Denies fever, chills, nausea vomiting diarrhea.  Reports foot pain has improved.   10/17 stopped abx Today 12/4: doing well no fever, chills. Reports some right foot numbness.  Review of Systems: Review of Systems  All other systems reviewed and are negative.   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   Hyperlipemia 11/23/2016    Social History   Tobacco Use   Smoking status: Former    Packs/day: 1.00    Years: 10.00    Total pack years: 10.00    Types: Cigarettes    Quit date: 02/06/1984  Years since quitting: 37.9   Smokeless tobacco: Former  Scientific laboratory technician Use: Never used  Substance Use Topics   Alcohol use: Yes    Comment: occasionally   Drug use: No    Family History  Problem Relation Age of Onset   Diabetes Mother    Heart disease Mother    Heart disease Father        old age at 73   Heart disease Brother 31       cabg   Early death Son 63       MVA    No Known Allergies  Health Maintenance  Topic Date Due   Medicare Annual Wellness (AWV)  Never done   COVID-19 Vaccine (1) Never done   Zoster Vaccines- Shingrix (1 of 2) Never done   Pneumonia Vaccine 80+ Years old (1 - PCV) Never done   INFLUENZA VACCINE  09/06/2021   DTaP/Tdap/Td (2  - Td or Tdap) 11/01/2026   COLONOSCOPY (Pts 45-29yr Insurance coverage will need to be confirmed)  11/22/2026   Hepatitis C Screening  Completed   HPV VACCINES  Aged Out    Objective:  Vitals:   01/09/22 0846  Weight: 242 lb (109.8 kg)  Height: _0  (1.88 m)   Body mass index is 31.07 kg/m.  Physical Exam Constitutional:      General: He is not in acute distress.    Appearance: He is normal weight. He is not toxic-appearing.  HENT:     Head: Normocephalic and atraumatic.     Right Ear: External ear normal.     Left Ear: External ear normal.     Nose: No congestion or rhinorrhea.     Mouth/Throat:     Mouth: Mucous membranes are moist.     Pharynx: Oropharynx is clear.  Eyes:     Extraocular Movements: Extraocular movements intact.     Conjunctiva/sclera: Conjunctivae normal.     Pupils: Pupils are equal, round, and reactive to light.  Cardiovascular:     Rate and Rhythm: Normal rate and regular rhythm.     Heart sounds: No murmur heard.    No friction rub. No gallop.  Pulmonary:     Effort: Pulmonary effort is normal.     Breath sounds: Normal breath sounds.  Abdominal:     General: Abdomen is flat. Bowel sounds are normal.     Palpations: Abdomen is soft.  Musculoskeletal:        General: No swelling. Normal range of motion.     Cervical back: Normal range of motion and neck supple.  Skin:    General: Skin is warm and dry.  Neurological:     General: No focal deficit present.     Mental Status: He is oriented to person, place, and time.  Psychiatric:        Mood and Affect: Mood normal.     Lab Results Lab Results  Component Value Date   WBC 5.6 11/22/2021   HGB 14.1 11/22/2021   HCT 42.2 11/22/2021   MCV 88.8 11/22/2021   PLT 227 11/22/2021    Lab Results  Component Value Date   CREATININE 1.05 11/22/2021   BUN 12 11/22/2021   NA 139 11/22/2021   K 4.7 11/22/2021   CL 106 11/22/2021   CO2 28 11/22/2021    Lab Results  Component Value Date    ALT 19 11/22/2021   AST 18 11/22/2021   ALKPHOS 98 06/30/2020   BILITOT  0.5 11/22/2021    Lab Results  Component Value Date   CHOL 193 06/30/2020   HDL 44 06/30/2020   LDLCALC 131 (H) 06/30/2020   TRIG 100 06/30/2020   No results found for: "LABRPR", "RPRTITER" No results found for: "HIV1RNAQUANT", "HIV1RNAVL", "CD4TABS"   Problem List Items Addressed This Visit   None  #Right foot 5th metatarsal base osteomyelitis -Patient stepped on nail few weeks prior to the OR.  He had developed swelling and tenderness has been a couple rounds of antibiotics which were stopped more than 7 days from open biopsy on 8/24. -On 8/11 MRI showed acute osteo of 5th metatarsal base -OR pathology on 8/24 showed slight inflammation consistent with osteomyelitis, or cultures are negative.  ID was called and he was started on Augmentin and doxycycline EOT 10/5. On 8/31 visit Augmentin dosing changed from 500n bid to 875/147m PO bid.  -Stopped  antibiotics on 10/17 (completed >6 weeks with Augmentin and doxy). Pt missed his MRI appointment. His last labs were stable with nl ESR(2) and CRP(4.9) on 10/17. Wound looks well healed, no pain but some numbness.   Plan: -Labs today cbc , cmp esr , crp. If abnormal, then will consider MRI.  -Counseled to F/U with podiatry for foot numbness(new shoes?) -Follow up in three months   MLaurice Record MD [facility] 01/09/2022, 8:48 AM

## 2022-01-10 LAB — COMPREHENSIVE METABOLIC PANEL
AG Ratio: 1.7 (calc) (ref 1.0–2.5)
ALT: 19 U/L (ref 9–46)
AST: 18 U/L (ref 10–35)
Albumin: 4 g/dL (ref 3.6–5.1)
Alkaline phosphatase (APISO): 88 U/L (ref 35–144)
BUN: 21 mg/dL (ref 7–25)
CO2: 26 mmol/L (ref 20–32)
Calcium: 8.5 mg/dL — ABNORMAL LOW (ref 8.6–10.3)
Chloride: 106 mmol/L (ref 98–110)
Creat: 1.04 mg/dL (ref 0.70–1.28)
Globulin: 2.3 g/dL (calc) (ref 1.9–3.7)
Glucose, Bld: 142 mg/dL — ABNORMAL HIGH (ref 65–99)
Potassium: 4.5 mmol/L (ref 3.5–5.3)
Sodium: 141 mmol/L (ref 135–146)
Total Bilirubin: 0.4 mg/dL (ref 0.2–1.2)
Total Protein: 6.3 g/dL (ref 6.1–8.1)

## 2022-01-10 LAB — CBC WITH DIFFERENTIAL/PLATELET
Absolute Monocytes: 450 cells/uL (ref 200–950)
Basophils Absolute: 40 cells/uL (ref 0–200)
Basophils Relative: 0.7 %
Eosinophils Absolute: 342 cells/uL (ref 15–500)
Eosinophils Relative: 6 %
HCT: 43.2 % (ref 38.5–50.0)
Hemoglobin: 14.5 g/dL (ref 13.2–17.1)
Lymphs Abs: 1151 cells/uL (ref 850–3900)
MCH: 29.8 pg (ref 27.0–33.0)
MCHC: 33.6 g/dL (ref 32.0–36.0)
MCV: 88.9 fL (ref 80.0–100.0)
MPV: 8.9 fL (ref 7.5–12.5)
Monocytes Relative: 7.9 %
Neutro Abs: 3716 cells/uL (ref 1500–7800)
Neutrophils Relative %: 65.2 %
Platelets: 252 10*3/uL (ref 140–400)
RBC: 4.86 10*6/uL (ref 4.20–5.80)
RDW: 13 % (ref 11.0–15.0)
Total Lymphocyte: 20.2 %
WBC: 5.7 10*3/uL (ref 3.8–10.8)

## 2022-01-10 LAB — SEDIMENTATION RATE: Sed Rate: 6 mm/h (ref 0–20)

## 2022-01-10 LAB — C-REACTIVE PROTEIN: CRP: 10 mg/L — ABNORMAL HIGH (ref <8.0)

## 2022-02-14 DIAGNOSIS — J342 Deviated nasal septum: Secondary | ICD-10-CM | POA: Diagnosis not present

## 2022-02-14 DIAGNOSIS — K219 Gastro-esophageal reflux disease without esophagitis: Secondary | ICD-10-CM | POA: Diagnosis not present

## 2022-02-14 DIAGNOSIS — R1314 Dysphagia, pharyngoesophageal phase: Secondary | ICD-10-CM | POA: Diagnosis not present

## 2022-03-01 ENCOUNTER — Other Ambulatory Visit: Payer: Self-pay | Admitting: Otolaryngology

## 2022-03-01 DIAGNOSIS — J385 Laryngeal spasm: Secondary | ICD-10-CM

## 2022-03-01 DIAGNOSIS — R1310 Dysphagia, unspecified: Secondary | ICD-10-CM

## 2022-03-16 ENCOUNTER — Ambulatory Visit
Admission: RE | Admit: 2022-03-16 | Discharge: 2022-03-16 | Disposition: A | Discharge status: Home / Self Care | Payer: PPO | Source: Ambulatory Visit | Attending: Otolaryngology | Admitting: Otolaryngology

## 2022-03-16 DIAGNOSIS — J385 Laryngeal spasm: Secondary | ICD-10-CM

## 2022-03-16 DIAGNOSIS — R1310 Dysphagia, unspecified: Secondary | ICD-10-CM

## 2022-04-06 ENCOUNTER — Encounter: Payer: Self-pay | Admitting: Radiology

## 2022-04-26 DIAGNOSIS — L2089 Other atopic dermatitis: Secondary | ICD-10-CM | POA: Diagnosis not present

## 2022-04-26 DIAGNOSIS — Z79899 Other long term (current) drug therapy: Secondary | ICD-10-CM | POA: Diagnosis not present

## 2022-04-26 DIAGNOSIS — L281 Prurigo nodularis: Secondary | ICD-10-CM | POA: Diagnosis not present

## 2022-06-09 DIAGNOSIS — M722 Plantar fascial fibromatosis: Secondary | ICD-10-CM | POA: Diagnosis not present

## 2022-09-13 DIAGNOSIS — I10 Essential (primary) hypertension: Secondary | ICD-10-CM | POA: Diagnosis not present

## 2022-09-13 DIAGNOSIS — R0789 Other chest pain: Secondary | ICD-10-CM | POA: Diagnosis not present

## 2022-10-30 DIAGNOSIS — Z79899 Other long term (current) drug therapy: Secondary | ICD-10-CM | POA: Diagnosis not present

## 2022-10-30 DIAGNOSIS — L2089 Other atopic dermatitis: Secondary | ICD-10-CM | POA: Diagnosis not present

## 2023-02-06 ENCOUNTER — Encounter: Payer: Self-pay | Admitting: Family Medicine

## 2023-02-06 ENCOUNTER — Ambulatory Visit (INDEPENDENT_AMBULATORY_CARE_PROVIDER_SITE_OTHER): Payer: PPO | Admitting: Family Medicine

## 2023-02-06 VITALS — BP 148/98 | HR 75 | Temp 97.7°F | Resp 16 | Ht 74.0 in | Wt 246.8 lb

## 2023-02-06 DIAGNOSIS — R0683 Snoring: Secondary | ICD-10-CM | POA: Diagnosis not present

## 2023-02-06 DIAGNOSIS — J4550 Severe persistent asthma, uncomplicated: Secondary | ICD-10-CM

## 2023-02-06 DIAGNOSIS — N529 Male erectile dysfunction, unspecified: Secondary | ICD-10-CM

## 2023-02-06 DIAGNOSIS — R7303 Prediabetes: Secondary | ICD-10-CM

## 2023-02-06 DIAGNOSIS — F339 Major depressive disorder, recurrent, unspecified: Secondary | ICD-10-CM

## 2023-02-06 DIAGNOSIS — E782 Mixed hyperlipidemia: Secondary | ICD-10-CM

## 2023-02-06 MED ORDER — SILDENAFIL CITRATE 20 MG PO TABS: 20.0000 mg | ORAL_TABLET | Freq: Every day | ORAL | Status: DC

## 2023-02-06 MED ORDER — GABAPENTIN 100 MG PO CAPS: 100.0000 mg | ORAL_CAPSULE | Freq: Three times a day (TID) | ORAL | 3 refills | Status: AC

## 2023-02-06 MED ORDER — SILDENAFIL CITRATE 20 MG PO TABS: 20.0000 mg | ORAL_TABLET | Freq: Every day | ORAL | 1 refills | Status: DC | PRN

## 2023-02-06 NOTE — Assessment & Plan Note (Addendum)
Checking CMP and lipids today.  10-year ASCVD risk score is 25%.  Will discuss starting statin next week when recheck blood pressure.

## 2023-02-06 NOTE — Progress Notes (Signed)
   Established Patient Office Visit  Subjective   Patient ID: [name], male    DOB: 03-Jun-1950  Age: 72 y.o. MRN: [id number]  Chief Complaint  Patient presents with   Medical Management of Chronic Issues   Toe Pain    Left foot/5th toe x2 months/dull ache/ no injury noted by patient    HPI Has foot pain left foot fifth toe.  Has been bothering him for several weeks now.  Has lancing pains to go through this. On Paxil  for depression.  Feels a little bit down right now.  Has lost 2 children a wife a brother and his parents does not think that the door is outside is making it any worse.  Does not want to increase his medication at this time.  Denies suicidal and homicidal ideation    ROS    Objective:     BP (!) 148/98 (BP Location: Left Arm, Patient Position: Sitting, Cuff Size: Normal)   Pulse 75   Temp 97.7 F (36.5 C) (Oral)   Resp 16   Ht 6' 2 (1.88 m)   Wt 246 lb 12.8 oz (111.9 kg)   SpO2 92%   BMI 31.69 kg/m    Physical Exam        No results found for any visits on 02/06/23.    The 10-year ASCVD risk score (Arnett DK, et al., 2019) is: 25%    Assessment & Plan:  Snoring -     Ambulatory referral to Sleep Studies  Asthma in adult, severe persistent, uncomplicated Assessment & Plan: Doing well on Symbicort  uses albuterol  almost daily though.  No nocturnal symptoms.   Prediabetes Assessment & Plan: Checking his A1c and CMP today.  Does have a family history of diabetes.  Checked his feet his fifth toe of his left foot is swollen and painful.  Has lancing pain in his toe that can occur anytime a day.  Will try gabapentin .  May need referral to podiatry      Return in about 3 months (around 05/07/2023) for CPE/HTN follow up.    James Word K Orvin Netter, MD

## 2023-02-06 NOTE — Assessment & Plan Note (Signed)
Doing well on Symbicort uses albuterol almost daily though.  No nocturnal symptoms.

## 2023-02-06 NOTE — Assessment & Plan Note (Signed)
 He is lost 2 children his wife's parents and her brother he is lost 2 children his wife her brother and his parents he has a lot of depression does not seem to be seasonal.  He has lost 2 children a wife and a brother and his parents has significant depression does not appear to be seasonal

## 2023-02-06 NOTE — Assessment & Plan Note (Signed)
 Checking his A1c and CMP today.  Does have a family history of diabetes.  Checked his feet his fifth toe of his left foot is swollen and painful.  Has lancing pain in his toe that can occur anytime a day.  Will try gabapentin .  May need referral to podiatry

## 2023-02-12 LAB — LIPID PANEL
Chol/HDL Ratio: 5.1 {ratio} — ABNORMAL HIGH (ref 0.0–5.0)
Cholesterol, Total: 221 mg/dL — ABNORMAL HIGH (ref 100–199)
HDL: 43 mg/dL (ref >39)
LDL Chol Calc (NIH): 147 mg/dL — ABNORMAL HIGH (ref 0–99)
Triglycerides: 170 mg/dL — ABNORMAL HIGH (ref 0–149)
VLDL Cholesterol Cal: 31 mg/dL (ref 5–40)

## 2023-02-12 LAB — COMPREHENSIVE METABOLIC PANEL
ALT: 22 [IU]/L (ref 0–44)
AST: 21 [IU]/L (ref 0–40)
Albumin: 4.1 g/dL (ref 3.8–4.8)
Alkaline Phosphatase: 111 [IU]/L (ref 44–121)
BUN/Creatinine Ratio: 13 (ref 10–24)
BUN: 12 mg/dL (ref 8–27)
Bilirubin Total: 0.7 mg/dL (ref 0.0–1.2)
CO2: 22 mmol/L (ref 20–29)
Calcium: 8.8 mg/dL (ref 8.6–10.2)
Chloride: 102 mmol/L (ref 96–106)
Creatinine, Ser: 0.93 mg/dL (ref 0.76–1.27)
Globulin, Total: 2.6 g/dL (ref 1.5–4.5)
Glucose: 135 mg/dL — ABNORMAL HIGH (ref 70–99)
Potassium: 4.5 mmol/L (ref 3.5–5.2)
Sodium: 138 mmol/L (ref 134–144)
Total Protein: 6.7 g/dL (ref 6.0–8.5)
eGFR: 88 mL/min/{1.73_m2} (ref >59)

## 2023-02-12 LAB — HEMOGLOBIN A1C
Est. average glucose Bld gHb Est-mCnc: 157 mg/dL
Hgb A1c MFr Bld: 7.1 % — ABNORMAL HIGH (ref 4.8–5.6)

## 2023-02-13 ENCOUNTER — Encounter: Payer: Self-pay | Admitting: Family Medicine

## 2023-02-13 ENCOUNTER — Ambulatory Visit: Payer: PPO | Admitting: Family Medicine

## 2023-02-13 VITALS — BP 148/105 | HR 67 | Temp 97.6°F | Resp 18 | Ht 74.0 in | Wt 243.6 lb

## 2023-02-13 DIAGNOSIS — F419 Anxiety disorder, unspecified: Secondary | ICD-10-CM | POA: Diagnosis not present

## 2023-02-13 DIAGNOSIS — B839 Helminthiasis, unspecified: Secondary | ICD-10-CM | POA: Diagnosis not present

## 2023-02-13 DIAGNOSIS — I152 Hypertension secondary to endocrine disorders: Secondary | ICD-10-CM | POA: Diagnosis not present

## 2023-02-13 DIAGNOSIS — E782 Mixed hyperlipidemia: Secondary | ICD-10-CM | POA: Diagnosis not present

## 2023-02-13 DIAGNOSIS — E118 Type 2 diabetes mellitus with unspecified complications: Secondary | ICD-10-CM | POA: Insufficient documentation

## 2023-02-13 DIAGNOSIS — E1159 Type 2 diabetes mellitus with other circulatory complications: Secondary | ICD-10-CM | POA: Diagnosis not present

## 2023-02-13 HISTORY — DX: Type 2 diabetes mellitus with other circulatory complications: E11.59

## 2023-02-13 MED ORDER — IVERMECTIN 3 MG PO TABS: 150.0000 ug/kg | ORAL_TABLET | Freq: Once | ORAL | 0 refills | Status: AC

## 2023-02-13 MED ORDER — IVERMECTIN 3 MG PO TABS: 3.0000 mg | ORAL_TABLET | Freq: Once | ORAL | Status: DC

## 2023-02-13 MED ORDER — LISINOPRIL 5 MG PO TABS: 5.0000 mg | ORAL_TABLET | Freq: Every day | ORAL | 3 refills | Status: AC

## 2023-02-13 MED ORDER — ALPRAZOLAM 0.5 MG PO TABS: 0.5000 mg | ORAL_TABLET | Freq: Two times a day (BID) | ORAL | 2 refills | Status: AC

## 2023-02-13 MED ORDER — ATORVASTATIN CALCIUM 20 MG PO TABS: 20.0000 mg | ORAL_TABLET | Freq: Every day | ORAL | 3 refills | Status: AC

## 2023-02-13 MED ORDER — LISINOPRIL 5 MG PO TABS
5.0000 mg | ORAL_TABLET | Freq: Every day | ORAL | 3 refills | Status: DC
Start: 2023-02-13 — End: 2023-05-07

## 2023-02-13 NOTE — Assessment & Plan Note (Signed)
 Plan to get LDL less than 70.  Total 221 HDL 43, LDL 147 on labs yesterday.  Starting Lipitor 20 mg.  He is to advise if he gets muscle soreness from this.

## 2023-02-13 NOTE — Assessment & Plan Note (Signed)
 [place] and [place] multiple livestock.  Wants to be treated for intestinal parasites.  Discussed ivermectin

## 2023-02-13 NOTE — Assessment & Plan Note (Signed)
 Takes Xanax 0.5 mg about once a week for anxiety.  It is a safety blanket for him to have a prescription.  Has asked for Xanax today

## 2023-02-13 NOTE — Assessment & Plan Note (Signed)
 Diagnosed with diabetes on last set of labs.  A1c 7.1% and fasting glucose was 135.  Will try diet, exercise and weight loss for 6 months.  Starting ACE inhibitor today.  Asked him to see the eye doctor.  Ask him to start an 81 mg aspirin at his evening meal.

## 2023-02-13 NOTE — Addendum Note (Signed)
 Addended by: Benay Pike on: 02/13/2023 10:06 AM   Modules accepted: Orders

## 2023-02-13 NOTE — Assessment & Plan Note (Signed)
 Starting ACE inhibitor 5 mg today.  Follow-up in the office in a month for labs and recheck blood pressure.

## 2023-02-13 NOTE — Progress Notes (Signed)
 3 9  Established Patient Office Visit  Subjective   Patient ID: JAKYRON FABRO, male    DOB: 10/20/1950  Age: 73 y.o. MRN: [id number]  Chief Complaint  Patient presents with   Medical Management of Chronic Issues    HPI Checked his labs yesterday A1c is now 7.1% fasting blood sugar was 135.  Mother had diabetes.  Discussed controlling diet exercise and weight as a mechanism for combating diabetes.  Discussed avoiding concentrated sugars like ice cream. Came in for a blood pressure check today and his blood pressure is still elevated.  Starting lisinopril  5 mg because he also has diabetes. 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  Relates to taking ivermectin  dose every 2 to 3 years. Has anxiety and takes Xanax  0.5 about 1 a week.  Prescription for March has run out will refill. Labs yesterday showed total cholesterol 221 HDL 43 and LDL 147.  Has no qualms about starting a a statin   (Optional):23778}  ROS    Objective:     BP (!) 148/105 (BP Location: Left Arm, Patient Position: Sitting, Cuff Size: Normal)   Pulse 67   Temp 97.6 F (36.4 C) (Oral)   Resp 18   Ht 6' 2 (1.88 m) Comment: per chart  Wt 243 lb 9.6 oz (110.5 kg)   SpO2 93%   BMI 31.28 kg/m    Physical Exam Vitals and nursing note reviewed.  Constitutional:      Appearance: Normal appearance.  HENT:     Head: Normocephalic and atraumatic.  Eyes:     Conjunctiva/sclera: Conjunctivae normal.  Cardiovascular:     Rate and Rhythm: Normal rate and regular rhythm.  Pulmonary:     Effort: Pulmonary effort is normal.     Breath sounds: Normal breath sounds.  Musculoskeletal:     Right lower leg: No edema.     Left lower leg: No edema.  Skin:    General: Skin is warm and dry.  Neurological:     Mental Status: He is alert and oriented to person, place, and time.  Psychiatric:        Mood and Affect: Mood normal.        Behavior: Behavior normal.        Thought Content: Thought  content normal.        Judgment: Judgment normal.          No results found for any visits on 02/13/23.    The 10-year ASCVD risk score (Arnett DK, et al., 2019) is: 50.8%    Assessment & Plan:  Anxiety Assessment & Plan: Takes Xanax  0.5 mg about once a week for anxiety.  It is a safety blanket for him to have a prescription.  Has asked for Xanax  today   Controlled type 2 diabetes mellitus with complication, without long-term current use of insulin (HCC) Assessment & Plan: Diagnosed with diabetes on last set of labs.  A1c 7.1% and fasting glucose was 135.  Will try diet, exercise and weight loss for 6 months.  Starting ACE inhibitor today.  Asked him to see the eye doctor.  Ask him to start an 81 mg aspirin at his evening meal.   Hypertension associated with diabetes [facility]) Assessment & Plan: Starting ACE inhibitor 5 mg today.  Follow-up in the office in a month for labs and recheck blood pressure.   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  Wants to be treated for intestinal parasites.  Discussed ivermectin    Moderate mixed hyperlipidemia not requiring statin therapy Assessment & Plan: Plan to get LDL less than 70.  Total 221 HDL 43, LDL 147 on labs yesterday.  Starting Lipitor 20 mg.  He is to advise if he gets muscle soreness from this.   Other orders -     Lisinopril ; Take 1 tablet (5 mg total) by mouth daily.  Dispense: 90 tablet; Refill: 3 -     ALPRAZolam ; Take 1 tablet (0.5 mg total) by mouth 2 (two) times daily.  Dispense: 60 tablet; Refill: 2 -     Lisinopril ; Take 1 tablet (5 mg total) by mouth daily.  Dispense: 90 tablet; Refill: 3 -     Atorvastatin  Calcium ; Take 1 tablet (20 mg total) by mouth daily.  Dispense: 90 tablet; Refill: 3     Return in about 4 weeks (around 03/13/2023) for BP recheck.    Milderd Manocchio K Devinne Epstein, MD

## 2023-02-19 LAB — CBC

## 2023-02-19 LAB — SPECIMEN STATUS REPORT

## 2023-02-28 ENCOUNTER — Other Ambulatory Visit: Payer: Self-pay

## 2023-02-28 DIAGNOSIS — N529 Male erectile dysfunction, unspecified: Secondary | ICD-10-CM

## 2023-02-28 MED ORDER — SILDENAFIL CITRATE 20 MG PO TABS: 20.0000 mg | ORAL_TABLET | Freq: Every day | ORAL | 1 refills | Status: DC | PRN

## 2023-02-28 NOTE — Telephone Encounter (Signed)
Last office visit: 02/13/2023 Next office visit: 03/13/23  Last refill: 02/06/23 10 tab 1 refill.   Medication pended for provider review.

## 2023-03-06 ENCOUNTER — Other Ambulatory Visit: Payer: Self-pay

## 2023-03-06 ENCOUNTER — Telehealth: Payer: Self-pay

## 2023-03-06 DIAGNOSIS — N529 Male erectile dysfunction, unspecified: Secondary | ICD-10-CM

## 2023-03-06 MED ORDER — SILDENAFIL CITRATE 20 MG PO TABS: 20.0000 mg | ORAL_TABLET | Freq: Every day | ORAL | 5 refills | Status: DC | PRN

## 2023-03-06 NOTE — Telephone Encounter (Signed)
Patient notified prescription for sildenafil sent per his request by Dr. Girtha Rm

## 2023-03-13 ENCOUNTER — Encounter: Payer: Self-pay | Admitting: Family Medicine

## 2023-03-13 ENCOUNTER — Ambulatory Visit (INDEPENDENT_AMBULATORY_CARE_PROVIDER_SITE_OTHER): Payer: PPO | Admitting: Family Medicine

## 2023-03-13 VITALS — BP 158/91 | HR 67 | Temp 97.7°F | Resp 18 | Ht 74.0 in | Wt 248.8 lb

## 2023-03-13 DIAGNOSIS — I10 Essential (primary) hypertension: Secondary | ICD-10-CM

## 2023-03-13 DIAGNOSIS — R7989 Other specified abnormal findings of blood chemistry: Secondary | ICD-10-CM | POA: Diagnosis not present

## 2023-03-13 DIAGNOSIS — J4541 Moderate persistent asthma with (acute) exacerbation: Secondary | ICD-10-CM

## 2023-03-13 DIAGNOSIS — R0683 Snoring: Secondary | ICD-10-CM

## 2023-03-13 DIAGNOSIS — E1159 Type 2 diabetes mellitus with other circulatory complications: Secondary | ICD-10-CM

## 2023-03-13 DIAGNOSIS — I152 Hypertension secondary to endocrine disorders: Secondary | ICD-10-CM

## 2023-03-13 DIAGNOSIS — E118 Type 2 diabetes mellitus with unspecified complications: Secondary | ICD-10-CM

## 2023-03-13 MED ORDER — ALBUTEROL SULFATE (2.5 MG/3ML) 0.083% IN NEBU
2.5000 mg | INHALATION_SOLUTION | Freq: Once | RESPIRATORY_TRACT | Status: AC
  Administered 2023-03-13: 2.5 mg via RESPIRATORY_TRACT

## 2023-03-13 MED ORDER — PREDNISONE 10 MG (21) PO TBPK: ORAL_TABLET | ORAL | 0 refills | Status: DC

## 2023-03-13 MED ORDER — DOXYCYCLINE HYCLATE 100 MG PO CAPS: 100.0000 mg | ORAL_CAPSULE | Freq: Two times a day (BID) | ORAL | 0 refills | Status: DC

## 2023-03-13 NOTE — Progress Notes (Signed)
 Established Patient Office Visit  Subjective   Patient ID: [name], male    DOB: 09-21-1950  Age: 73 y.o. MRN: [id number]  Chief Complaint  Patient presents with   Medical Management of Chronic Issues    HTN    HPI Came in today to address his blood pressure.  Started lisinopril  5 mg a week ago.  No side effects that he can feel. Blood pressure is down to 152/90 He used his Symbicort  2 puffs twice daily but has not use any albuterol .  Oxygen  saturation is in the low 90s.  Denies any shortness of breath with ambulation but then he tells me he is short of breath so much of the time he does not notice it anymore.  Denies coughing or sputum production.  No fever, chills or night sweats, appetites is okay.  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 Denies any CP.   Has not seen pulmonology in years. A1c was 7.1% checking urine microalbumin today Sleep study referral made but he have not contacted him yet. PHQ-9 is 4, GAD-7 is 2.    ROS    Objective:     BP (!) 158/91 (BP Location: Right Arm, Patient Position: Sitting, Cuff Size: Normal)   Pulse 67   Temp 97.7 F (36.5 C) (Oral)   Resp 18   Wt 248 lb 12.8 oz (112.9 kg)   SpO2 96%   BMI 31.94 kg/m    Physical Exam Vitals and nursing note reviewed.  Constitutional:      Appearance: Normal appearance.  HENT:     Head: Normocephalic and atraumatic.  Eyes:     Conjunctiva/sclera: Conjunctivae normal.  Cardiovascular:     Rate and Rhythm: Normal rate and regular rhythm.  Pulmonary:     Effort: Pulmonary effort is normal.     Breath sounds: Normal breath sounds. No wheezing.  Musculoskeletal:     Right lower leg: No edema.     Left lower leg: No edema.  Skin:    General: Skin is warm and dry.  Neurological:     Mental Status: He is alert and oriented to person, place, and time.  Psychiatric:        Mood and Affect: Mood normal.        Behavior: Behavior normal.        Thought Content: Thought  content normal.        Judgment: Judgment normal.       Diabetic foot exam was performed with the following findings:   No data filed      No results found for any visits on 03/13/23.    The 10-year ASCVD risk score (Arnett DK, et al., 2019) is: 55%    Assessment & Plan:  Controlled type 2 diabetes mellitus with complication, without long-term current use of insulin (HCC) Assessment & Plan: Working on diet control.  A1c 7.1%.  Started lisinopril  at last visit for elevated blood pressure and to cover renal disease of diabetes.  Orders: -     Ambulatory referral to Ophthalmology -     HM Diabetes Foot Exam -     Microalbumin / creatinine urine ratio  Primary hypertension  Hypertension associated with diabetes [facility]) Assessment & Plan: Started lisinopril  5mg .  No side effects.  Checking renal function.  BP improved.  Asthma exacerbation may be running BP up as well.    Orders: -     CMP14+EGFR  Moderate persistent asthma with acute exacerbation -  Ambulatory referral to Pulmonology -     Albuterol  Sulfate -     For home use only DME Nebulizer machine -     predniSONE ; Take po according to package instructions  Dispense: 21 tablet; Refill: 0  Snoring -     PSG SLEEP STUDY  Asthma in adult, moderate persistent, with acute exacerbation Assessment & Plan: Gave him a nebulizer treatment of albuterol .  Examination following neb showed wheezing throughout.  O2 sat improved to 95%.  Giving him a prednisone  taper.  Will refer to Pulmonology.        Return in about 3 months (around 06/10/2023).    Newell Frater K Steffie Waggoner, MD

## 2023-03-13 NOTE — Assessment & Plan Note (Addendum)
Gave him a nebulizer treatment of albuterol.  Examination following neb showed wheezing throughout.  O2 sat improved to 95%.  Giving him a prednisone taper.  Will refer to Pulmonology.

## 2023-03-13 NOTE — Assessment & Plan Note (Signed)
Working on diet control.  A1c 7.1%.  Started lisinopril at last visit for elevated blood pressure and to cover renal disease of diabetes.

## 2023-03-13 NOTE — Assessment & Plan Note (Signed)
Started lisinopril 5mg .  No side effects.  Checking renal function.  BP improved.  Asthma exacerbation may be running BP up as well.

## 2023-03-14 LAB — CMP14+EGFR
ALT: 18 [IU]/L (ref 0–44)
AST: 18 [IU]/L (ref 0–40)
Albumin: 3.9 g/dL (ref 3.8–4.8)
Alkaline Phosphatase: 123 [IU]/L — ABNORMAL HIGH (ref 44–121)
BUN/Creatinine Ratio: 10 (ref 10–24)
BUN: 11 mg/dL (ref 8–27)
Bilirubin Total: 0.4 mg/dL (ref 0.0–1.2)
CO2: 22 mmol/L (ref 20–29)
Calcium: 8.5 mg/dL — ABNORMAL LOW (ref 8.6–10.2)
Chloride: 102 mmol/L (ref 96–106)
Creatinine, Ser: 1.08 mg/dL (ref 0.76–1.27)
Globulin, Total: 2.3 g/dL (ref 1.5–4.5)
Glucose: 159 mg/dL — ABNORMAL HIGH (ref 70–99)
Potassium: 4.2 mmol/L (ref 3.5–5.2)
Sodium: 139 mmol/L (ref 134–144)
Total Protein: 6.2 g/dL (ref 6.0–8.5)
eGFR: 73 mL/min/{1.73_m2} (ref >59)

## 2023-03-14 LAB — MICROALBUMIN / CREATININE URINE RATIO
Creatinine, Urine: 160.8 mg/dL
Microalb/Creat Ratio: 7 mg/g{creat} (ref 0–29)
Microalbumin, Urine: 10.7 ug/mL

## 2023-03-15 ENCOUNTER — Other Ambulatory Visit: Payer: Self-pay

## 2023-03-15 DIAGNOSIS — R7989 Other specified abnormal findings of blood chemistry: Secondary | ICD-10-CM

## 2023-03-16 ENCOUNTER — Encounter: Payer: Self-pay | Admitting: *Deleted

## 2023-03-17 LAB — SPECIMEN STATUS REPORT

## 2023-03-17 LAB — VITAMIN D 25 HYDROXY (VIT D DEFICIENCY, FRACTURES): Vit D, 25-Hydroxy: 40.9 ng/mL (ref 30.0–100.0)

## 2023-03-27 ENCOUNTER — Other Ambulatory Visit: Payer: Self-pay | Admitting: Family Medicine

## 2023-03-27 ENCOUNTER — Other Ambulatory Visit: Payer: Self-pay

## 2023-03-27 DIAGNOSIS — N529 Male erectile dysfunction, unspecified: Secondary | ICD-10-CM

## 2023-03-27 MED ORDER — SILDENAFIL CITRATE 20 MG PO TABS
ORAL_TABLET | ORAL | 11 refills | Status: DC
Start: 2023-03-27 — End: 2023-05-07

## 2023-04-02 ENCOUNTER — Telehealth: Payer: Self-pay

## 2023-04-02 NOTE — Telephone Encounter (Signed)
 Patient scheduled for sleep study 04/27/2023 at 8:00pm///fax transmition confirmation received from Amy Dahlquist/[facility]

## 2023-04-23 DIAGNOSIS — H26491 Other secondary cataract, right eye: Secondary | ICD-10-CM | POA: Diagnosis not present

## 2023-04-23 DIAGNOSIS — Z961 Presence of intraocular lens: Secondary | ICD-10-CM | POA: Diagnosis not present

## 2023-04-27 ENCOUNTER — Ambulatory Visit: Attending: Otolaryngology

## 2023-04-27 DIAGNOSIS — R0683 Snoring: Secondary | ICD-10-CM | POA: Insufficient documentation

## 2023-04-27 NOTE — Telephone Encounter (Addendum)
 Amy  From [facility] Phone [phone] Fax # [phone]  Caller states the approval letter for patient sleep study was not received, caller requesting the letter to be re faxed.

## 2023-04-27 NOTE — Telephone Encounter (Signed)
 Letter has been faxed to [facility].

## 2023-04-30 DIAGNOSIS — Z111 Encounter for screening for respiratory tuberculosis: Secondary | ICD-10-CM | POA: Diagnosis not present

## 2023-04-30 DIAGNOSIS — Z79899 Other long term (current) drug therapy: Secondary | ICD-10-CM | POA: Diagnosis not present

## 2023-04-30 DIAGNOSIS — E559 Vitamin D deficiency, unspecified: Secondary | ICD-10-CM | POA: Diagnosis not present

## 2023-04-30 DIAGNOSIS — L2089 Other atopic dermatitis: Secondary | ICD-10-CM | POA: Diagnosis not present

## 2023-05-02 ENCOUNTER — Other Ambulatory Visit: Payer: Self-pay | Admitting: Family Medicine

## 2023-05-02 MED ORDER — PAROXETINE HCL 20 MG PO TABS: 20.0000 mg | ORAL_TABLET | Freq: Every day | ORAL | 1 refills | Status: DC

## 2023-05-02 NOTE — Telephone Encounter (Unsigned)
 Copied from CRM [id number]. Topic: Clinical - Medication Refill >> May 02, 2023  9:30 AM Eusebio Me wrote: Most Recent Primary Care Visit:  Provider: Alease Medina  Department: [facility]  Visit Type: OFFICE VISIT  Date: 03/13/2023  Medication: PARoxetine (PAXIL) 20 MG tablet  Has the patient contacted their pharmacy? Yes, call provider for update (Agent: If no, request that the patient contact the pharmacy for the refill. If patient does not wish to contact the pharmacy document the reason why and proceed with request.) (Agent: If yes, when and what did the pharmacy advise?)  Is this the correct pharmacy for this prescription? Yes If no, delete pharmacy and type the correct one.  This is the patient's preferred pharmacy:   [id number], [place] - [address] AT [facility] [address] [place] [address] [place] [address] Phone: [phone] Fax: [phone]   Has the prescription been filled recently? No  Is the patient out of the medication? Yes  Has the patient been seen for an appointment in the last year OR does the patient have an upcoming appointment? Yes  Can we respond through MyChart? No  Agent: Please be advised that Rx refills may take up to 3 business days. We ask that you follow-up with your pharmacy.

## 2023-05-03 ENCOUNTER — Other Ambulatory Visit: Payer: Self-pay | Admitting: Family Medicine

## 2023-05-03 ENCOUNTER — Telehealth: Payer: Self-pay | Admitting: Family Medicine

## 2023-05-03 MED ORDER — PAROXETINE HCL 20 MG PO TABS: 20.0000 mg | ORAL_TABLET | Freq: Every day | ORAL | 1 refills | Status: DC

## 2023-05-03 NOTE — Telephone Encounter (Signed)
 Pt walked in stating he has been trying to get a prescription and the pharmacy said they have sent messages and no response.   Generic Paxil

## 2023-05-05 DIAGNOSIS — G4733 Obstructive sleep apnea (adult) (pediatric): Secondary | ICD-10-CM | POA: Diagnosis not present

## 2023-05-07 ENCOUNTER — Ambulatory Visit (INDEPENDENT_AMBULATORY_CARE_PROVIDER_SITE_OTHER): Payer: PPO | Admitting: Family Medicine

## 2023-05-07 ENCOUNTER — Encounter: Payer: Self-pay | Admitting: Family Medicine

## 2023-05-07 VITALS — BP 156/95 | HR 62 | Temp 98.1°F | Resp 18 | Ht 74.0 in | Wt 243.0 lb

## 2023-05-07 DIAGNOSIS — I152 Hypertension secondary to endocrine disorders: Secondary | ICD-10-CM | POA: Diagnosis not present

## 2023-05-07 DIAGNOSIS — F419 Anxiety disorder, unspecified: Secondary | ICD-10-CM

## 2023-05-07 DIAGNOSIS — E1159 Type 2 diabetes mellitus with other circulatory complications: Secondary | ICD-10-CM

## 2023-05-07 DIAGNOSIS — I10 Essential (primary) hypertension: Secondary | ICD-10-CM | POA: Diagnosis not present

## 2023-05-07 DIAGNOSIS — R0683 Snoring: Secondary | ICD-10-CM | POA: Diagnosis not present

## 2023-05-07 DIAGNOSIS — E118 Type 2 diabetes mellitus with unspecified complications: Secondary | ICD-10-CM

## 2023-05-07 DIAGNOSIS — M19072 Primary osteoarthritis, left ankle and foot: Secondary | ICD-10-CM | POA: Diagnosis not present

## 2023-05-07 LAB — POCT GLYCOSYLATED HEMOGLOBIN (HGB A1C): Hemoglobin A1C: 6.9 % — AB (ref 4.0–5.6)

## 2023-05-07 MED ORDER — PAROXETINE HCL 20 MG PO TABS: 20.0000 mg | ORAL_TABLET | Freq: Every day | ORAL | 1 refills | Status: DC

## 2023-05-07 MED ORDER — AMLODIPINE BESYLATE 5 MG PO TABS
5.0000 mg | ORAL_TABLET | Freq: Every day | ORAL | 1 refills | Status: AC
Start: 2023-05-07 — End: ?

## 2023-05-07 MED ORDER — NAPROXEN 375 MG PO TABS: 375.0000 mg | ORAL_TABLET | Freq: Two times a day (BID) | ORAL | 2 refills | Status: AC

## 2023-05-07 NOTE — Assessment & Plan Note (Signed)
 Currently controlling diabetes with diet and exercise.  A1c is 6.9% which is improved from 7.1 % at his last check 3 months ago

## 2023-05-07 NOTE — Progress Notes (Signed)
 Established Patient Office Visit  Subjective   Patient ID: [name], male    DOB: 19-Dec-1950  Age: 73 y.o. MRN: [id number]  Chief Complaint  Patient presents with   Medical Management of Chronic Issues    HPI 73 yo male with moderate persistent asthma, HTN, DMT2, suspected OSA, anxiety, mixed hyperlidemia.  Started lisinopril and blood pressure is still elevated.  No difficulty taking lisinopril.   His left ankle hurt over the weekend.  Then it swelled and the pain actually got better.  No trauma to his ankle he is aware of.  He has been taking over-the-counter Naprosyn twice daily and this usually controls the pain in his ankle.  He has had gout in the great toe of his left foot but is certain this is not gout because it is not painful enough. He has got a referral to pulmonology for his asthma but has not heard from them yet.  He did his sleep study but the results are not available. A1c 6.9% today.  He is controlling his diabetes with diet and exercise.    ROS    Objective:     BP (!) 156/95 (BP Location: Left Arm, Patient Position: Sitting, Cuff Size: Normal)   Pulse 62   Temp 98.1 F (36.7 C) (Oral)   Resp 18   Ht 6\' 2"  (1.88 m)   Wt 243 lb (110.2 kg)   SpO2 92%   BMI 31.20 kg/m    Physical Exam Vitals and nursing note reviewed.  Constitutional:      Appearance: Normal appearance.  HENT:     Head: Normocephalic and atraumatic.  Eyes:     Conjunctiva/sclera: Conjunctivae normal.  Cardiovascular:     Rate and Rhythm: Normal rate and regular rhythm.  Pulmonary:     Effort: Pulmonary effort is normal.     Breath sounds: Normal breath sounds.  Musculoskeletal:     Right lower leg: No edema.     Left lower leg: No edema.  Feet:     Comments: Swelling of ankle of the left foot.  Trace edema of the left foot. Skin:    General: Skin is warm and dry.  Neurological:     Mental Status: He is alert and oriented to person, place, and time.  Psychiatric:         Mood and Affect: Mood normal.        Behavior: Behavior normal.        Thought Content: Thought content normal.        Judgment: Judgment normal.          Results for orders placed or performed in visit on 05/07/23  POCT glycosylated hemoglobin (Hb A1C)  Result Value Ref Range   Hemoglobin A1C 6.9 (A) 4.0 - 5.6 %   HbA1c POC (<> result, manual entry)     HbA1c, POC (prediabetic range)     HbA1c, POC (controlled diabetic range)        The 10-year ASCVD risk score (Arnett DK, et al., 2019) is: 54.2%    Assessment & Plan:  Controlled type 2 diabetes mellitus with complication, without long-term current use of insulin (HCC) Assessment & Plan: Currently controlling diabetes with diet and exercise.  A1c is 6.9% which is improved from 7.1 % at his last check 3 months ago  Orders: -     POCT glycosylated hemoglobin (Hb A1C)  Anxiety -     PARoxetine HCl; Take 1 tablet (20 mg total) by  mouth daily.  Dispense: 90 tablet; Refill: 1  Primary osteoarthritis of left ankle -     Naproxen; Take 1 tablet (375 mg total) by mouth 2 (two) times daily with a meal.  Dispense: 60 tablet; Refill: 2  Primary hypertension -     amLODIPine Besylate; Take 1 tablet (5 mg total) by mouth daily.  Dispense: 90 tablet; Refill: 1  Hypertension associated with diabetes (HCC) Assessment & Plan: BP not controlled with lisinopril 5 mg.  Has been taking Naprosyn which may have inhibited lisinopril.  Adding Norvasc 5 mg to lisinopril 5 mg today.  Follow-up in a month for recheck.  Check your blood pressure at home.  Record these findings and bring it with you.   Snores Assessment & Plan: Has completed his sleep study but results are not available yet.   Localized, primary osteoarthritis of the ankle and foot, left Assessment & Plan: Naprosyn 375 twice daily with a meal.  Compression will help as well like an ankle sleeve.      Return in about 4 weeks (around 06/04/2023) for BP recheck.    Alease Medina, MD

## 2023-05-07 NOTE — Assessment & Plan Note (Signed)
 Has completed his sleep study but results are not available yet.

## 2023-05-07 NOTE — Assessment & Plan Note (Signed)
 BP not controlled with lisinopril 5 mg.  Has been taking Naprosyn which may have inhibited lisinopril.  Adding Norvasc 5 mg to lisinopril 5 mg today.  Follow-up in a month for recheck.  Check your blood pressure at home.  Record these findings and bring it with you.

## 2023-05-07 NOTE — Assessment & Plan Note (Signed)
 Naprosyn 375 twice daily with a meal.  Compression will help as well like an ankle sleeve.

## 2023-05-08 ENCOUNTER — Encounter: Payer: Self-pay | Admitting: Family Medicine

## 2023-05-29 ENCOUNTER — Encounter: Payer: Self-pay | Admitting: Family Medicine

## 2023-05-30 ENCOUNTER — Other Ambulatory Visit: Payer: Self-pay | Admitting: Family Medicine

## 2023-05-30 DIAGNOSIS — B839 Helminthiasis, unspecified: Secondary | ICD-10-CM

## 2023-05-30 MED ORDER — IVERMECTIN 3 MG PO TABS: 150.0000 ug/kg | ORAL_TABLET | Freq: Once | ORAL | Status: DC

## 2023-05-31 ENCOUNTER — Encounter: Payer: Self-pay | Admitting: Family Medicine

## 2023-06-04 ENCOUNTER — Other Ambulatory Visit: Payer: Self-pay | Admitting: Family Medicine

## 2023-06-04 DIAGNOSIS — Z79899 Other long term (current) drug therapy: Secondary | ICD-10-CM | POA: Diagnosis not present

## 2023-06-04 DIAGNOSIS — E559 Vitamin D deficiency, unspecified: Secondary | ICD-10-CM | POA: Diagnosis not present

## 2023-06-06 ENCOUNTER — Ambulatory Visit: Admitting: Family Medicine

## 2023-06-07 ENCOUNTER — Encounter: Payer: Self-pay | Admitting: Family Medicine

## 2023-06-07 LAB — COMPREHENSIVE METABOLIC PANEL WITH GFR
ALT: 25 IU/L (ref 0–44)
AST: 20 IU/L (ref 0–40)
Albumin: 4.3 g/dL (ref 3.8–4.8)
Alkaline Phosphatase: 124 IU/L — ABNORMAL HIGH (ref 44–121)
BUN/Creatinine Ratio: 13 (ref 10–24)
BUN: 15 mg/dL (ref 8–27)
Bilirubin Total: 0.5 mg/dL (ref 0.0–1.2)
CO2: 21 mmol/L (ref 20–29)
Calcium: 8.9 mg/dL (ref 8.6–10.2)
Chloride: 101 mmol/L (ref 96–106)
Creatinine, Ser: 1.17 mg/dL (ref 0.76–1.27)
Globulin, Total: 2.3 g/dL (ref 1.5–4.5)
Glucose: 240 mg/dL — ABNORMAL HIGH (ref 70–99)
Potassium: 4.6 mmol/L (ref 3.5–5.2)
Sodium: 139 mmol/L (ref 134–144)
Total Protein: 6.6 g/dL (ref 6.0–8.5)
eGFR: 66 mL/min/{1.73_m2} (ref >59)

## 2023-06-07 LAB — CBC/DIFF AMBIGUOUS DEFAULT
Basophils Absolute: 0.1 10*3/uL (ref 0.0–0.2)
Basos: 1 %
EOS (ABSOLUTE): 0.5 10*3/uL — ABNORMAL HIGH (ref 0.0–0.4)
Eos: 6 %
Hematocrit: 45 % (ref 37.5–51.0)
Hemoglobin: 14.6 g/dL (ref 13.0–17.7)
Immature Grans (Abs): 0 10*3/uL (ref 0.0–0.1)
Immature Granulocytes: 0 %
Lymphocytes Absolute: 1.7 10*3/uL (ref 0.7–3.1)
Lymphs: 23 %
MCH: 28.9 pg (ref 26.6–33.0)
MCHC: 32.4 g/dL (ref 31.5–35.7)
MCV: 89 fL (ref 79–97)
Monocytes Absolute: 0.4 10*3/uL (ref 0.1–0.9)
Monocytes: 6 %
Neutrophils Absolute: 4.8 10*3/uL (ref 1.4–7.0)
Neutrophils: 64 %
Platelets: 297 10*3/uL (ref 150–450)
RBC: 5.06 x10E6/uL (ref 4.14–5.80)
RDW: 13 % (ref 11.6–15.4)
WBC: 7.5 10*3/uL (ref 3.4–10.8)

## 2023-06-07 LAB — SPECIMEN STATUS REPORT

## 2023-06-07 LAB — QUANTIFERON-TB GOLD PLUS
QuantiFERON Mitogen Value: >10 [IU]/mL
QuantiFERON Nil Value: 0.04 [IU]/mL
QuantiFERON TB1 Ag Value: 0.04 [IU]/mL
QuantiFERON TB2 Ag Value: 0.08 [IU]/mL
QuantiFERON-TB Gold Plus: NEGATIVE

## 2023-06-11 ENCOUNTER — Ambulatory Visit: Payer: PPO | Admitting: Family Medicine

## 2023-06-11 NOTE — Progress Notes (Signed)
 Patient not seen.

## 2023-06-13 ENCOUNTER — Ambulatory Visit (INDEPENDENT_AMBULATORY_CARE_PROVIDER_SITE_OTHER): Admitting: Family Medicine

## 2023-06-13 ENCOUNTER — Encounter: Payer: Self-pay | Admitting: Family Medicine

## 2023-06-13 VITALS — BP 137/89 | HR 71 | Temp 98.1°F | Resp 18 | Ht 74.0 in | Wt 235.0 lb

## 2023-06-13 DIAGNOSIS — J4541 Moderate persistent asthma with (acute) exacerbation: Secondary | ICD-10-CM

## 2023-06-13 DIAGNOSIS — G4733 Obstructive sleep apnea (adult) (pediatric): Secondary | ICD-10-CM

## 2023-06-13 DIAGNOSIS — J4550 Severe persistent asthma, uncomplicated: Secondary | ICD-10-CM

## 2023-06-13 HISTORY — DX: Obstructive sleep apnea (adult) (pediatric): G47.33

## 2023-06-13 MED ORDER — AZITHROMYCIN 250 MG PO TABS: ORAL_TABLET | ORAL | 0 refills | Status: AC

## 2023-06-13 MED ORDER — BUDESONIDE-FORMOTEROL FUMARATE 160-4.5 MCG/ACT IN AERO: 2.0000 | INHALATION_SPRAY | Freq: Two times a day (BID) | RESPIRATORY_TRACT | 3 refills | Status: DC

## 2023-06-13 MED ORDER — PREDNISONE 10 MG (48) PO TBPK: ORAL_TABLET | Freq: Every day | ORAL | 0 refills | Status: DC

## 2023-06-13 NOTE — Assessment & Plan Note (Addendum)
 During his sleep study, his AHI was 15.9 and his SpO2 nadir was 80%. His snoring was eliminated with CPAP titration. The CPAP set at 9 cm H2O appears to be effective. His AHI was 1.3 and his SpO2 nadir was 89 with CPAP.  Advised Zepbound has an FDA indication to treat OSA.  Since he also has diabetes this would be an ideal treatment for him.  Will plan to set this up over the next visit

## 2023-06-13 NOTE — Assessment & Plan Note (Signed)
 Acute asthma exacerbation secondary to a viral upper respiratory infection.  Azithromycin  and Sterapred taper.  Refilled Symbicort .  Follow-up in the clinic if you are not doing better.

## 2023-06-13 NOTE — Progress Notes (Signed)
 Established Patient Office Visit  Subjective   Patient ID: [name], male    DOB: 20-Sep-1950  Age: 73 y.o. MRN: [id number]  Chief Complaint  Patient presents with   Medical Management of Chronic Issues    HPI delightful 73 year old male with moderate persistent asthma, HTN, DMT2 (diet and exercise controlled), OSA, anxiety, mixed hyperlipidemia. He reports he has had a cold for the last week.  He feels like the cold is settled in his chest.  He is wheezing more and is having some shortness of breath with ambulation.  Subjective fevers.  He has an albuterol  MDI but does not use it very often.  He is coughing up sputum.  He os out of his Symbicort .   Reviewed his sleep study: during his sleep study, his AHI was 15.9 and his SpO2 nadir was 80%. His snoring was eliminated with CPAP titration. The CPAP set at 9 cm H2O appears to be effective. His AHI was 1.3 and his SpO2 nadir was 89 with CPAP.       ROS    Objective:     BP 137/89 (BP Location: Left Arm, Patient Position: Sitting, Cuff Size: Normal)   Pulse 71   Temp 98.1 F (36.7 C) (Oral)   Resp 18   Ht 6\' 2"  (1.88 m)   Wt 235 lb (106.6 kg)   SpO2 92%   BMI 30.17 kg/m    Physical Exam Vitals and nursing note reviewed.  Constitutional:      Appearance: Normal appearance.  HENT:     Head: Normocephalic and atraumatic.  Eyes:     Conjunctiva/sclera: Conjunctivae normal.  Cardiovascular:     Rate and Rhythm: Normal rate and regular rhythm.  Pulmonary:     Effort: Pulmonary effort is normal.     Breath sounds: Wheezing present.  Musculoskeletal:     Right lower leg: No edema.     Left lower leg: No edema.  Skin:    General: Skin is warm and dry.  Neurological:     Mental Status: He is alert and oriented to person, place, and time.  Psychiatric:        Mood and Affect: Mood normal.        Behavior: Behavior normal.        Thought Content: Thought content normal.        Judgment: Judgment normal.           No results found for any visits on 06/13/23.    The 10-year ASCVD risk score (Arnett DK, et al., 2019) is: 48.3%    Assessment & Plan:  OSA (obstructive sleep apnea) Assessment & Plan: During his sleep study, his AHI was 15.9 and his SpO2 nadir was 80%. His snoring was eliminated with CPAP titration. The CPAP set at 9 cm H2O appears to be effective. His AHI was 1.3 and his SpO2 nadir was 89 with CPAP.  Advised Zepbound has an FDA indication to treat OSA.  Since he also has diabetes this would be an ideal treatment for him.  Will plan to set this up over the next visit   Asthma in adult, severe persistent, uncomplicated -     Budesonide -Formoterol  Fumarate; Inhale 2 puffs into the lungs 2 (two) times daily.  Dispense: 3 each; Refill: 3 -     Azithromycin ; Take 2 tablets on day 1, then 1 tablet daily on days 2 through 5  Dispense: 6 tablet; Refill: 0 -     predniSONE ; Take  by mouth daily. 12-day taper pack, use as directed for taper  Dispense: 48 tablet; Refill: 0  Asthma in adult, moderate persistent, with acute exacerbation Assessment & Plan: Acute asthma exacerbation secondary to a viral upper respiratory infection.  Azithromycin  and Sterapred taper.  Refilled Symbicort .  Follow-up in the clinic if you are not doing better.      Return if symptoms worsen or fail to improve.    Akashdeep Chuba K Malayja Freund, MD

## 2023-06-13 NOTE — Assessment & Plan Note (Signed)
 05/07/2023 A1c 6.9%.  Controls his diabetes with diet and exercise

## 2023-06-14 ENCOUNTER — Ambulatory Visit: Payer: Self-pay

## 2023-06-14 NOTE — Telephone Encounter (Signed)
 Copied from CRM [id number]. Topic: Clinical - Prescription Issue >> Jun 14, 2023  9:27 AM Rennis Case wrote: Reason for CRM: Needing clarification on prednisone  packet. Patient at pharmacy.

## 2023-06-28 DIAGNOSIS — G4733 Obstructive sleep apnea (adult) (pediatric): Secondary | ICD-10-CM | POA: Diagnosis not present

## 2023-06-29 ENCOUNTER — Telehealth: Payer: Self-pay | Admitting: Family Medicine

## 2023-06-29 NOTE — Telephone Encounter (Signed)
 [employer] correspondence received. I called patient at 2:07 to schedule an appt. for his clinical evaluation with Dr. Ziglar.  LVM

## 2023-07-10 ENCOUNTER — Other Ambulatory Visit: Payer: Self-pay | Admitting: Family Medicine

## 2023-07-10 DIAGNOSIS — B89 Unspecified parasitic disease: Secondary | ICD-10-CM

## 2023-07-10 MED ORDER — IVERMECTIN 3 MG PO TABS: 200.0000 ug/kg | ORAL_TABLET | Freq: Once | ORAL | Status: DC

## 2023-07-23 ENCOUNTER — Ambulatory Visit (INDEPENDENT_AMBULATORY_CARE_PROVIDER_SITE_OTHER)

## 2023-07-23 ENCOUNTER — Ambulatory Visit
Admission: EM | Admit: 2023-07-23 | Discharge: 2023-07-23 | Disposition: A | Discharge status: Home / Self Care | Attending: Physician Assistant | Admitting: Physician Assistant

## 2023-07-23 DIAGNOSIS — S60921A Unspecified superficial injury of right hand, initial encounter: Secondary | ICD-10-CM | POA: Diagnosis not present

## 2023-07-23 DIAGNOSIS — M79641 Pain in right hand: Secondary | ICD-10-CM

## 2023-07-23 DIAGNOSIS — S6991XA Unspecified injury of right wrist, hand and finger(s), initial encounter: Secondary | ICD-10-CM

## 2023-07-23 DIAGNOSIS — M19041 Primary osteoarthritis, right hand: Secondary | ICD-10-CM | POA: Diagnosis not present

## 2023-07-23 NOTE — ED Provider Notes (Signed)
 [facility]    CSN: [id number] Arrival date & time: 07/23/23  1142      History   Chief Complaint Chief Complaint  Patient presents with   Hand Injury    HPI [name] is a 73 y.o. male presenting for right hand injury that occurred 2 days ago. He says he and another man were lifting a 200 lb bulldozer battery and he injured the hand in the process. Reports pain of the fifth digit. Previous multiple surgeries on this finger. 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 No numbness or weakness. Pain has improved a bit over the past couple of days. No other concerns.  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   Hyperlipemia 11/23/2016   Hypertension associated with diabetes (HCC) 02/13/2023   OSA (obstructive sleep apnea) 06/13/2023    Patient Active Problem List   Diagnosis Date Noted   OSA (obstructive sleep apnea) 06/13/2023   Snores 05/07/2023   Localized, primary osteoarthritis of the ankle and foot, left 05/07/2023   Asthma in adult, moderate persistent, with acute exacerbation 03/13/2023   Anxiety 02/13/2023   Controlled diabetes mellitus type 2 with complications (HCC) 02/13/2023   Hypertension associated with diabetes (HCC) 02/13/2023   Long term current use of systemic steroids 07/11/2017   History of vitamin D  deficiency 04/04/2017   Polymyalgia rheumatica syndrome (HCC) 12/07/2016   Hyperlipemia 11/23/2016   Goiter, 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   History of gout 06/12/2016   Depression, recurrent (HCC) 06/12/2016    Past Surgical History:  Procedure Laterality Date   APPENDECTOMY     BONE BIOPSY Right 09/29/2021   Procedure: Open biopsy of right 5th metatarsal  base;  Surgeon: Amada Backer, MD;  Location: [facility];   Service: Orthopedics;  Laterality: Right;   CHOLECYSTECTOMY     COLONOSCOPY N/A 11/21/2016   Procedure: COLONOSCOPY;  Surgeon: Alanda Allegra, MD;  Location: [facility];  Service: Gastroenterology;  Laterality: N/A;   HAND SURGERY         Home Medications    Prior to Admission medications   Medication Sig Start Date End Date Taking? Authorizing Provider  ALPRAZolam  (XANAX ) 0.5 MG tablet Take 1 tablet (0.5 mg total) by mouth 2 (two) times daily. 02/13/23   Ziglar, Susan K, MD  amLODipine  (NORVASC ) 5 MG tablet Take 1 tablet (5 mg total) by mouth daily. 05/07/23   Ziglar, Susan K, MD  amoxicillin -clavulanate (AUGMENTIN ) 875-125 MG tablet Take 1 tablet by mouth 2 (two) times daily. Patient not taking: Reported on 06/13/2023 10/06/21   Orlie Bjornstad, MD  atorvastatin  (LIPITOR) 20 MG tablet Take 1 tablet (20 mg total) by mouth daily. 02/13/23   Ziglar, Susan K, MD  budesonide -formoterol  (SYMBICORT ) 160-4.5 MCG/ACT inhaler Inhale 2 puffs into the lungs 2 (two) times daily. 06/13/23   Ziglar, Susan K, MD  doxycycline  (VIBRAMYCIN ) 100 MG capsule Take 1 capsule (100 mg total) by mouth 2 (two) times daily. Patient not taking: Reported on 06/13/2023 03/13/23   Ziglar, Susan K, MD  gabapentin  (NEURONTIN ) 100 MG capsule Take 1 capsule (100 mg total) by mouth 3 (three) times daily. 02/06/23   Ziglar, Susan K, MD  hydrOXYzine (ATARAX) 25 MG tablet Take 25 mg by  mouth daily. Patient not taking: Reported on 06/13/2023 10/30/22   [provider]  ketoconazole 2%-triamcinolone  0.1% 1:2 cream mixture Apply topically daily. 06/15/20   Artemio Larry, NP  lisinopril  (ZESTRIL ) 5 MG tablet Take 1 tablet (5 mg total) by mouth daily. Patient not taking: Reported on 06/13/2023 02/13/23   Ziglar, Susan K, MD  naproxen  (NAPROSYN ) 375 MG tablet Take 1 tablet (375 mg total) by mouth 2 (two) times daily with a meal. 05/07/23   Ziglar, Lacinda Pica, MD  PARoxetine  (PAXIL ) 20 MG tablet Take 1 tablet (20 mg total) by mouth daily. 05/07/23    Ziglar, Susan K, MD  predniSONE  (STERAPRED UNI-PAK 21 TAB) 10 MG (21) TBPK tablet Take po according to package instructions Patient not taking: Reported on 06/13/2023 03/13/23   Ziglar, Susan K, MD  predniSONE  (STERAPRED UNI-PAK 48 TAB) 10 MG (48) TBPK tablet Take by mouth daily. 12-day taper pack, use as directed for taper 06/13/23   Ziglar, Susan K, MD  PROAIR  HFA 108 (90 Base) MCG/ACT inhaler INHALE 2 PUFFS INTO THE LUNGS EVERY 4 HOURS AS NEEDED FOR WHEEZING OR SHORTNESS OF BREATH 08/17/20   Meldon Sport, MD  sildenafil  (REVATIO ) 20 MG tablet Take 1 tablet (20 mg total) by mouth daily as needed (Erectile dysfunction). 03/06/23   Ziglar, Susan K, MD  triamcinolone  cream (KENALOG ) 0.1 % Apply 1 Application topically 2 (two) times daily. Patient not taking: Reported on 06/13/2023 10/30/22   [provider]  Vitamin D , Ergocalciferol , (DRISDOL) 1.25 MG (50000 UNIT) CAPS capsule Take 50,000 Units by mouth 3 (three) times a week. Patient not taking: Reported on 06/13/2023 10/30/22   [provider]    Family History Family History  Problem Relation Age of Onset   Diabetes Mother    Heart disease Mother    Heart disease Father        old age at 66   Heart disease Brother 68       cabg   Early death Son 78       MVA    Social History Social History   Tobacco Use   Smoking status: Former    Current packs/day: 0.00    Average packs/day: 1 pack/day for 10.0 years (10.0 ttl pk-yrs)    Types: Cigarettes    Start date: 02/05/1974    Quit date: 02/06/1984    Years since quitting: 39.4    Passive exposure: Never   Smokeless tobacco: Former  Building services engineer status: Never Used  Substance Use Topics   Alcohol use: Yes    Comment: occasionally   Drug use: No     Allergies   Patient has no known allergies.   Review of Systems Review of Systems  Musculoskeletal:  Positive for arthralgias and joint swelling.  Skin:  Negative for color change and wound.  Neurological:   Negative for weakness and numbness.     Physical Exam Triage Vital Signs ED Triage Vitals  Encounter Vitals Group     BP 07/23/23 1155 (!) 148/80     Girls Systolic BP Percentile --      Girls Diastolic BP Percentile --      Boys Systolic BP Percentile --      Boys Diastolic BP Percentile --      Pulse Rate 07/23/23 1155 (!) 56     Resp 07/23/23 1155 16     Temp 07/23/23 1155 98.7 F (37.1 C)     Temp Source 07/23/23 1155  Oral     SpO2 07/23/23 1155 93 %     Weight --      Height --      Head Circumference --      Peak Flow --      Pain Score 07/23/23 1154 8     Pain Loc --      Pain Education --      Exclude from Growth Chart --    No data found.  Updated Vital Signs BP (!) 148/80 (BP Location: Right Arm)   Pulse (!) 56   Temp 98.7 F (37.1 C) (Oral)   Resp 16   SpO2 93%     Physical Exam Vitals and nursing note reviewed.  Constitutional:      General: He is not in acute distress.    Appearance: He is well-developed.  HENT:     Head: Normocephalic and atraumatic.   Eyes:     General: No scleral icterus.    Conjunctiva/sclera: Conjunctivae normal.    Cardiovascular:     Rate and Rhythm: Normal rate.     Pulses: Normal pulses.  Pulmonary:     Effort: Pulmonary effort is normal. No respiratory distress.   Musculoskeletal:     Cervical back: Neck supple.     Comments: RIGHT HAND: There is mild/moderate swelling of the proximal phalanx with tenderness. Reduced ROM at PIP joint. Good pulses.    Skin:    General: Skin is warm and dry.     Capillary Refill: Capillary refill takes less than 2 seconds.   Neurological:     General: No focal deficit present.     Mental Status: He is alert. Mental status is at baseline.     Motor: No weakness.     Gait: Gait normal.   Psychiatric:        Mood and Affect: Mood normal.        Behavior: Behavior normal.      UC Treatments / Results  Labs (all labs ordered are listed, but only abnormal results are  displayed) Labs Reviewed - No data to display  EKG   Radiology DG Hand Complete Right Result Date: 07/23/2023 CLINICAL DATA:  Status post injury EXAM: RIGHT HAND - COMPLETE 3+ VIEW COMPARISON:  March 12, 2004 FINDINGS: No acute fractures.  Osteoarthrosis of the D IP and PIP joints 8 mm cyst along the radial aspect of the base of the fifth digit proximal phalanx. 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 IMPRESSION: No acute fractures Electronically Signed   By: Fredrich Jefferson M.D.   On: 07/23/2023 13:15    Procedures Procedures (including critical care time)  Medications Ordered in UC Medications - No data to display  Initial Impression / Assessment and Plan / UC Course  I have reviewed the triage vital signs and the nursing notes.  Pertinent labs & imaging results that were available during my care of the patient were reviewed by me and considered in my medical decision making (see chart for details).   73 y/o male presents for right fifth finger pain following injury 2 days ago. History of previous multiple surgeries on this hand, specifically in the area of pain.   X-ray of right hand obtained. X-ray negative for acute fracture.  Discussed results with patient.  Advised supportive care with Tylenol , cryotherapy and avoiding  painful activities.   Final Clinical Impressions(s) / UC Diagnoses   Final diagnoses:  Right hand pain  Injury of right hand, initial encounter   Discharge Instructions   None    ED Prescriptions   None    PDMP not reviewed this encounter.   Floydene Hy, PA-C 07/23/23 1331

## 2023-07-23 NOTE — ED Triage Notes (Signed)
 Patient presents to UC for right hand injury x 2 days. States a 200 lb battery on top of his hand. Hx of hand surgery to the right hand. No OTC meds for pain relief.

## 2023-07-29 DIAGNOSIS — G4733 Obstructive sleep apnea (adult) (pediatric): Secondary | ICD-10-CM | POA: Diagnosis not present

## 2023-08-28 DIAGNOSIS — G4733 Obstructive sleep apnea (adult) (pediatric): Secondary | ICD-10-CM | POA: Diagnosis not present

## 2023-09-06 DIAGNOSIS — Z79899 Other long term (current) drug therapy: Secondary | ICD-10-CM | POA: Diagnosis not present

## 2023-09-06 DIAGNOSIS — L2089 Other atopic dermatitis: Secondary | ICD-10-CM | POA: Diagnosis not present

## 2023-09-07 ENCOUNTER — Ambulatory Visit: Admitting: Family Medicine

## 2023-09-27 ENCOUNTER — Other Ambulatory Visit: Payer: Self-pay | Admitting: Family Medicine

## 2023-09-27 ENCOUNTER — Encounter: Payer: Self-pay | Admitting: Family Medicine

## 2023-09-27 MED ORDER — IVERMECTIN 6 MG PO TABS: 21.0000 mg | ORAL_TABLET | Freq: Every day | ORAL | 0 refills | Status: DC

## 2023-10-09 MED ORDER — IVERMECTIN 6 MG PO TABS: 21.0000 mg | ORAL_TABLET | Freq: Every day | ORAL | 0 refills | Status: DC

## 2023-10-17 ENCOUNTER — Other Ambulatory Visit: Payer: Self-pay

## 2023-10-17 ENCOUNTER — Emergency Department (HOSPITAL_COMMUNITY)
Admission: EM | Admit: 2023-10-17 | Discharge: 2023-10-17 | Disposition: A | Discharge status: Home / Self Care | Attending: Emergency Medicine | Admitting: Emergency Medicine

## 2023-10-17 ENCOUNTER — Encounter (HOSPITAL_COMMUNITY): Payer: Self-pay

## 2023-10-17 DIAGNOSIS — W260XXA Contact with knife, initial encounter: Secondary | ICD-10-CM | POA: Insufficient documentation

## 2023-10-17 DIAGNOSIS — E119 Type 2 diabetes mellitus without complications: Secondary | ICD-10-CM | POA: Diagnosis not present

## 2023-10-17 DIAGNOSIS — I10 Essential (primary) hypertension: Secondary | ICD-10-CM | POA: Diagnosis not present

## 2023-10-17 DIAGNOSIS — Y9389 Activity, other specified: Secondary | ICD-10-CM | POA: Diagnosis not present

## 2023-10-17 DIAGNOSIS — Z79899 Other long term (current) drug therapy: Secondary | ICD-10-CM | POA: Diagnosis not present

## 2023-10-17 DIAGNOSIS — S79921A Unspecified injury of right thigh, initial encounter: Secondary | ICD-10-CM | POA: Diagnosis present

## 2023-10-17 DIAGNOSIS — S71111A Laceration without foreign body, right thigh, initial encounter: Secondary | ICD-10-CM | POA: Diagnosis not present

## 2023-10-17 MED ORDER — LIDOCAINE HCL (PF) 2 % IJ SOLN
INTRAMUSCULAR | Status: AC
  Filled 2023-10-17: qty 5

## 2023-10-17 MED ORDER — LIDOCAINE-EPINEPHRINE (PF) 2 %-1:200000 IJ SOLN
INTRAMUSCULAR | Status: AC
  Filled 2023-10-17: qty 20

## 2023-10-17 NOTE — Discharge Instructions (Addendum)
 It was a pleasure taking care of you today.  You were seen in the ER for a laceration to your right thigh.  We cleaned your wound and sutured to close.  Is important to limit bending and heavy lifting while you are healing to prevent the wound from opening back up.  Keep it clean and dry, you can wash it with mild soap and water  daily, it is okay to shower, do not submerge it in a bath or go swimming.  You can use a thin layer of bacitracin ointment as well.  Come back if you have redness, warmth, increased swelling, drainage, fever or severe pain.  Have wound check in few days with your PCP, have the sutures removed in 10 to 14 days.

## 2023-10-17 NOTE — ED Triage Notes (Signed)
 Pt arrived via pOV from home following an accidental laceration above his right knee. Pt reports using a brand new knife to open a package, and the knife slipped and he cut himself. Pt applied a [employer] Tourniquet apprx 30 mins ago.

## 2023-10-17 NOTE — ED Notes (Signed)
 ED Provider at bedside. Pressure applied.

## 2023-10-17 NOTE — ED Provider Notes (Signed)
 [facility] Provider Note   CSN: [id number] Arrival date & time: 10/17/23  1613     Patient presents with: Laceration   [name] is a 73 y.o. male.  He has history of gout, depression, polymyalgia rheumatica, hypertension, diabetes.  Presents to ER for laceration to the right thigh that occurred about 30 minutes prior to arrival.  He states he was cutting open a package with a brand-new pocket knife and excellently sliced across his leg.  He is able to walk on it, he applied pressure bandage using towels and a short tide around his leg and drove himself to the ER.  Denies dizziness or loss of consciousness.  Last tetanus was about 3 years ago he reports. He denies any numbness, tingling or weakness.  {Add pertinent medical, surgical, social history, OB history to HPI:32947}  Laceration      Prior to Admission medications   Medication Sig Start Date End Date Taking? Authorizing Provider  ALPRAZolam  (XANAX ) 0.5 MG tablet Take 1 tablet (0.5 mg total) by mouth 2 (two) times daily. 02/13/23   Ziglar, Susan K, MD  amLODipine  (NORVASC ) 5 MG tablet Take 1 tablet (5 mg total) by mouth daily. 05/07/23   Ziglar, Susan K, MD  amoxicillin -clavulanate (AUGMENTIN ) 875-125 MG tablet Take 1 tablet by mouth 2 (two) times daily. Patient not taking: Reported on 06/13/2023 10/06/21   Dennise Kingsley, MD  atorvastatin  (LIPITOR) 20 MG tablet Take 1 tablet (20 mg total) by mouth daily. 02/13/23   Ziglar, Susan K, MD  budesonide -formoterol  (SYMBICORT ) 160-4.5 MCG/ACT inhaler Inhale 2 puffs into the lungs 2 (two) times daily. 06/13/23   Ziglar, Susan K, MD  doxycycline  (VIBRAMYCIN ) 100 MG capsule Take 1 capsule (100 mg total) by mouth 2 (two) times daily. Patient not taking: Reported on 06/13/2023 03/13/23   Ziglar, Susan K, MD  gabapentin  (NEURONTIN ) 100 MG capsule Take 1 capsule (100 mg total) by mouth 3 (three) times daily. 02/06/23   Ziglar, Susan K, MD  hydrOXYzine  (ATARAX) 25 MG tablet Take 25 mg by mouth daily. Patient not taking: Reported on 06/13/2023 10/30/22   [provider]  Ivermectin  6 MG TABS Take 3.5 tablets (21 mg total) by mouth daily. 10/09/23   Ziglar, Susan K, MD  ketoconazole 2%-triamcinolone  0.1% 1:2 cream mixture Apply topically daily. 06/15/20   Elnor Fairy HERO, NP  lisinopril  (ZESTRIL ) 5 MG tablet Take 1 tablet (5 mg total) by mouth daily. Patient not taking: Reported on 06/13/2023 02/13/23   Ziglar, Susan K, MD  naproxen  (NAPROSYN ) 375 MG tablet Take 1 tablet (375 mg total) by mouth 2 (two) times daily with a meal. 05/07/23   Ziglar, Devere MARLA, MD  PARoxetine  (PAXIL ) 20 MG tablet Take 1 tablet (20 mg total) by mouth daily. 05/07/23   Ziglar, Susan K, MD  predniSONE  (STERAPRED UNI-PAK 21 TAB) 10 MG (21) TBPK tablet Take po according to package instructions Patient not taking: Reported on 06/13/2023 03/13/23   Ziglar, Susan K, MD  predniSONE  (STERAPRED UNI-PAK 48 TAB) 10 MG (48) TBPK tablet Take by mouth daily. 12-day taper pack, use as directed for taper 06/13/23   Ziglar, Susan K, MD  PROAIR  HFA 108 (90 Base) MCG/ACT inhaler INHALE 2 PUFFS INTO THE LUNGS EVERY 4 HOURS AS NEEDED FOR WHEEZING OR SHORTNESS OF BREATH 08/17/20   Tobie Suzzane MARLA, MD  sildenafil  (REVATIO ) 20 MG tablet Take 1 tablet (20 mg total) by mouth daily as needed (Erectile dysfunction). 03/06/23  Ziglar, Susan K, MD  triamcinolone  cream (KENALOG ) 0.1 % Apply 1 Application topically 2 (two) times daily. Patient not taking: Reported on 06/13/2023 10/30/22   [provider]  Vitamin D , Ergocalciferol , (DRISDOL) 1.25 MG (50000 UNIT) CAPS capsule Take 50,000 Units by mouth 3 (three) times a week. Patient not taking: Reported on 06/13/2023 10/30/22   [provider]    Allergies: Patient has no known allergies.    Review of Systems  Updated Vital Signs BP (!) 168/110 (BP Location: Right Arm)   Pulse 73   Temp 98.2 F (36.8 C) (Oral)   Resp 18   Ht 6' 2 (1.88 m)    Wt 104.3 kg   SpO2 94%   BMI 29.53 kg/m   Physical Exam Vitals and nursing note reviewed.  Constitutional:      General: He is not in acute distress.    Appearance: He is well-developed.  HENT:     Head: Normocephalic and atraumatic.  Eyes:     Conjunctiva/sclera: Conjunctivae normal.  Cardiovascular:     Rate and Rhythm: Normal rate and regular rhythm.     Heart sounds: No murmur heard. Pulmonary:     Effort: Pulmonary effort is normal. No respiratory distress.     Breath sounds: Normal breath sounds.  Abdominal:     Palpations: Abdomen is soft.     Tenderness: There is no abdominal tenderness.  Musculoskeletal:        General: No swelling.     Cervical back: Neck supple.     Comments: Patient can fully flex and extend right knee without difficulty.  DP and PT pulse in right foot are palpable 2+.  Sensation intact to light touch throughout right leg diffusely.  Skin:    General: Skin is warm and dry.     Capillary Refill: Capillary refill takes less than 2 seconds.     Comments: Gaping 10 cm laceration to right distal thigh with moderate bleeding.   Neurological:     General: No focal deficit present.     Mental Status: He is alert and oriented to person, place, and time.  Psychiatric:        Mood and Affect: Mood normal.     (all labs ordered are listed, but only abnormal results are displayed) Labs Reviewed - No data to display  EKG: None  Radiology: No results found.  {Document cardiac monitor, telemetry assessment procedure when appropriate:32947} Procedures   Medications Ordered in the ED  lidocaine  HCl (PF) (XYLOCAINE ) 2 % injection (  Return to [facility] 10/17/23 1636)  lidocaine -EPINEPHrine  (XYLOCAINE  W/EPI) 2 %-1:200000 (PF) injection (  Given 10/17/23 1659)      {Click here for ABCD2, HEART and other calculators REFRESH Note before signing:1}                              Medical Decision Making This patient presents to the ED for concern of thigh  laceration, this involves an extensive number of treatment options, and is a complaint that carries with it a high risk of complications and morbidity.  The differential diagnosis includes ***   Co morbidities that complicate the patient evaluation :   ***   Additional history obtained:  Additional history obtained from *** External records from outside source obtained and reviewed including ***   Lab Tests:  I Ordered, and personally interpreted labs.  The pertinent results include:  ***    Imaging  Studies ordered:  I ordered imaging studies including *** which shows *** I independently visualized and interpreted imaging within scope of identifying emergent findings  I agree with the radiologist interpretation   Cardiac Monitoring: / EKG:  The patient was maintained on a cardiac monitor.  I personally viewed and interpreted the cardiac monitored which showed an underlying rhythm of: ***   Consultations Obtained:  I requested consultation with the ***,  and discussed lab and imaging findings as well as pertinent plan - they recommend: ***   Problem List / ED Course / Critical interventions / Medication management  *** I ordered medication including ***  for ***  Reevaluation of the patient after these medicines showed that the patient {resolved/improved/worsened:23923::improved} I have reviewed the patients home medicines and have made adjustments as needed   Social Determinants of Health:  ***   Test / Admission - Considered:  ***     ***  {Document critical care time when appropriate  Document review of labs and clinical decision tools ie CHADS2VASC2, etc  Document your independent review of radiology images and any outside records  Document your discussion with family members, caretakers and with consultants  Document social determinants of health affecting pt's care  Document your decision making why or why not admission, treatments were  needed:32947:::1}   Final diagnoses:  None    ED Discharge Orders     None

## 2023-10-24 ENCOUNTER — Ambulatory Visit (INDEPENDENT_AMBULATORY_CARE_PROVIDER_SITE_OTHER): Admitting: Family Medicine

## 2023-10-24 ENCOUNTER — Encounter: Payer: Self-pay | Admitting: Family Medicine

## 2023-10-24 VITALS — BP 138/88 | HR 62 | Temp 98.4°F | Resp 18 | Ht 74.0 in | Wt 243.0 lb

## 2023-10-24 DIAGNOSIS — E1159 Type 2 diabetes mellitus with other circulatory complications: Secondary | ICD-10-CM | POA: Diagnosis not present

## 2023-10-24 DIAGNOSIS — I152 Hypertension secondary to endocrine disorders: Secondary | ICD-10-CM

## 2023-10-24 DIAGNOSIS — Z136 Encounter for screening for cardiovascular disorders: Secondary | ICD-10-CM | POA: Diagnosis not present

## 2023-10-24 DIAGNOSIS — E118 Type 2 diabetes mellitus with unspecified complications: Secondary | ICD-10-CM

## 2023-10-24 DIAGNOSIS — J4550 Severe persistent asthma, uncomplicated: Secondary | ICD-10-CM | POA: Diagnosis not present

## 2023-10-24 DIAGNOSIS — L03115 Cellulitis of right lower limb: Secondary | ICD-10-CM

## 2023-10-24 DIAGNOSIS — Z9189 Other specified personal risk factors, not elsewhere classified: Secondary | ICD-10-CM

## 2023-10-24 LAB — POCT GLYCOSYLATED HEMOGLOBIN (HGB A1C): Hemoglobin A1C: 6.9 % — AB (ref 4.0–5.6)

## 2023-10-24 MED ORDER — PROAIR HFA 108 (90 BASE) MCG/ACT IN AERS: 2.0000 | INHALATION_SPRAY | RESPIRATORY_TRACT | 0 refills | Status: DC | PRN

## 2023-10-24 MED ORDER — DOXYCYCLINE HYCLATE 100 MG PO CAPS: 100.0000 mg | ORAL_CAPSULE | Freq: Two times a day (BID) | ORAL | 0 refills | Status: DC

## 2023-10-24 MED ORDER — LISINOPRIL 5 MG PO TABS: 5.0000 mg | ORAL_TABLET | Freq: Every day | ORAL | 3 refills | Status: AC

## 2023-10-24 MED ORDER — ATORVASTATIN CALCIUM 20 MG PO TABS: 20.0000 mg | ORAL_TABLET | Freq: Every day | ORAL | 3 refills | Status: AC

## 2023-10-24 MED ORDER — BUDESONIDE-FORMOTEROL FUMARATE 160-4.5 MCG/ACT IN AERO: 2.0000 | INHALATION_SPRAY | Freq: Two times a day (BID) | RESPIRATORY_TRACT | 3 refills | Status: AC

## 2023-10-24 NOTE — Progress Notes (Signed)
 Established Patient Office Visit  Subjective   Patient ID: [name], male    DOB: January 14, 1951  Age: 73 y.o. MRN: [id number]  Chief Complaint  Patient presents with   Hospitalization Follow-up    HPI Discussed the use of AI scribe software for clinical note transcription with the patient, who gave verbal consent to proceed.  History of Present Illness   [name] is a 73 year old male who presents with a follow-up of a leg wound.  He presents for follow-up of a leg wound that was sutured seven days ago. He reports that the wound appears 'more redder today than it has been' and feels warm to the touch. He experiences some pain, but it is not severe. He reports that at the time of suturing, he was told the wound was clean and that no antibiotics were prescribed. He is not allergic to any antibiotics.  He has a history of high blood pressure and asthma. His asthma is well-controlled and rarely limits his activities, especially when he uses his Symbicort  as prescribed. He confirms using Symbicort  regularly, which helps improve his symptoms.He also has an albuterol  inhaler for PRN use.    He was surprised to learn about his elevated A1c level of 6.9, as he was previously told he had prediabetes. He recalls being below the threshold for diabetes during his last check. He consumes carbohydrates such as bread, ice cream, and potatoes, which may contribute to his condition. 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  He has not received a flu shot or pneumonia vaccine and expresses skepticism about vaccines developed rapidly, such as the COVID-19 vaccine. He is involved in cattle farming and reports good hay production this year. He is considering starting a walking routine for exercise.     He has prescriptions for amlodipine  and lisinopril  5mg  but has not been taking these.  After resting, his BP is 138/88.    He has not been taking his Lipitor.   Advised because he has diabetes he is at increased risk for CAD especially MI.  Discussed the cardiac CT scan.  His current 10-year ASCVD risk is 48%.  He does take an 81 mg aspirin daily   ROS    Objective:     BP 138/88 (BP Location: Left Arm, Patient Position: Sitting, Cuff Size: Normal)   Pulse 62   Temp 98.4 F (36.9 C) (Oral)   Resp 18   Ht 6' 2 (1.88 m)   Wt 243 lb (110.2 kg)   SpO2 91%   BMI 31.20 kg/m    Physical Exam Vitals reviewed.  Constitutional:      Appearance: Normal appearance.  HENT:     Head: Normocephalic.  Eyes:     General:        Right eye: No discharge.        Left eye: No discharge.  Cardiovascular:     Rate and Rhythm: Normal rate.  Pulmonary:     Effort: Pulmonary effort is normal.  Musculoskeletal:     Comments: 10-12 cm laceration repair anterior left thigh.  Some surrounding erythema and warm to the touch.  Some mucopurulent discharge at sutures.    Neurological:     Mental Status: He is alert and oriented to person, place, and time.  Psychiatric:        Mood and Affect: Mood normal.        Behavior: Behavior  normal.        Thought Content: Thought content normal.        Judgment: Judgment normal.          Results for orders placed or performed in visit on 10/24/23  POCT glycosylated hemoglobin (Hb A1C)  Result Value Ref Range   Hemoglobin A1C 6.9 (A) 4.0 - 5.6 %   HbA1c POC (<> result, manual entry)     HbA1c, POC (prediabetic range)     HbA1c, POC (controlled diabetic range)        The 10-year ASCVD risk score (Arnett DK, et al., 2019) is: 48.7%    Assessment & Plan:  Controlled type 2 diabetes mellitus with complication, without long-term current use of insulin (HCC) -     POCT glycosylated hemoglobin (Hb A1C) -     CBC with Differential/Platelet -     Comprehensive metabolic panel with GFR -     Lipid panel -     TSH + free T4 -     Microalbumin / creatinine urine ratio -     Lisinopril ; Take 1 tablet (5 mg  total) by mouth daily.  Dispense: 90 tablet; Refill: 3 -     CT CARDIAC SCORING (SELF PAY ONLY); Future -     Atorvastatin  Calcium ; Take 1 tablet (20 mg total) by mouth daily.  Dispense: 90 tablet; Refill: 3  Asthma in adult, severe persistent, uncomplicated -     ProAir  HFA; Inhale 2 puffs into the lungs every 4 (four) hours as needed for wheezing or shortness of breath.  Dispense: 8.5 g; Refill: 0 -     Budesonide -Formoterol  Fumarate; Inhale 2 puffs into the lungs 2 (two) times daily.  Dispense: 3 each; Refill: 3  Hypertension associated with diabetes (HCC) -     CBC with Differential/Platelet -     Comprehensive metabolic panel with GFR -     Lipid panel -     TSH + free T4 -     Lisinopril ; Take 1 tablet (5 mg total) by mouth daily.  Dispense: 90 tablet; Refill: 3 -     Atorvastatin  Calcium ; Take 1 tablet (20 mg total) by mouth daily.  Dispense: 90 tablet; Refill: 3  Cellulitis of right lower extremity -     Doxycycline  Hyclate; Take 1 capsule (100 mg total) by mouth 2 (two) times daily.  Dispense: 20 capsule; Refill: 0  Encounter for screening for coronary artery disease in patient with risk for coronary artery disease greater than 20% in next 10 years Assessment & Plan: Discussed the coronary artery CT scan.  He is interssted in doing this    Assessment and Plan    Wound infection of leg with sutures Redness, warmth, and pus around the sutured wound on the leg, indicating infection. Mild pain and numbness present. Infection is concerning. - Prescribe doxycycline  100mg  to be taken twice daily. - Advise to avoid activities that may strain the sutures, such as playing golf or lifting heavy objects, until the sutures are removed. - Instruct on proper suture removal technique if he chooses to remove them himself after at least two weeks.  Type 2 diabetes mellitus Type 2 diabetes mellitus with an A1c of 6.9%. Previously noted to have prediabetes. Consumes a diet high in  carbohydrates. Prefers lifestyle modifications over medication due to concerns about potential nausea from Ozempic. - Advise dietary modifications to reduce intake of carbohydrates such as potatoes, corn, rice, bread, pasta, and sweets. - Encourage increased physical  activity, including considering starting a walking routine. - Discuss the option of starting Ozempic if lifestyle modifications are insufficient, with a plan to reassess.  Asthma Asthma is well-controlled with current use of Symbicort . Rarely prevents him from engaging in activities. - Continue current use of Symbicort  as prescribed. -  Albuterol  PRN  General Health Maintenance Not interested in receiving the flu shot or pneumonia vaccine. Discussed the importance of vaccines and advancements in vaccine technology.        Return in about 3 months (around 01/23/2024).    Jaysion Ramseyer K Clarion Mooneyhan, MD

## 2023-10-24 NOTE — Assessment & Plan Note (Signed)
 Discussed the coronary artery CT scan.  He is interssted in doing this

## 2023-10-25 LAB — CBC WITH DIFFERENTIAL/PLATELET
Basophils Absolute: 0.1 x10E3/uL (ref 0.0–0.2)
Basos: 1 %
EOS (ABSOLUTE): 0.4 x10E3/uL (ref 0.0–0.4)
Eos: 6 %
Hematocrit: 45 % (ref 37.5–51.0)
Hemoglobin: 14.1 g/dL (ref 13.0–17.7)
Immature Grans (Abs): 0 x10E3/uL (ref 0.0–0.1)
Immature Granulocytes: 0 %
Lymphocytes Absolute: 1.5 x10E3/uL (ref 0.7–3.1)
Lymphs: 23 %
MCH: 29.1 pg (ref 26.6–33.0)
MCHC: 31.3 g/dL — ABNORMAL LOW (ref 31.5–35.7)
MCV: 93 fL (ref 79–97)
Monocytes Absolute: 0.5 x10E3/uL (ref 0.1–0.9)
Monocytes: 7 %
Neutrophils Absolute: 4.3 x10E3/uL (ref 1.4–7.0)
Neutrophils: 63 %
Platelets: 313 x10E3/uL (ref 150–450)
RBC: 4.84 x10E6/uL (ref 4.14–5.80)
RDW: 13.5 % (ref 11.6–15.4)
WBC: 6.7 x10E3/uL (ref 3.4–10.8)

## 2023-10-25 LAB — COMPREHENSIVE METABOLIC PANEL WITH GFR
ALT: 22 IU/L (ref 0–44)
AST: 23 IU/L (ref 0–40)
Albumin: 4 g/dL (ref 3.8–4.8)
Alkaline Phosphatase: 106 IU/L (ref 47–123)
BUN/Creatinine Ratio: 13 (ref 10–24)
BUN: 14 mg/dL (ref 8–27)
Bilirubin Total: 0.4 mg/dL (ref 0.0–1.2)
CO2: 22 mmol/L (ref 20–29)
Calcium: 8.7 mg/dL (ref 8.6–10.2)
Chloride: 105 mmol/L (ref 96–106)
Creatinine, Ser: 1.08 mg/dL (ref 0.76–1.27)
Globulin, Total: 2.2 g/dL (ref 1.5–4.5)
Glucose: 124 mg/dL — ABNORMAL HIGH (ref 70–99)
Potassium: 4.8 mmol/L (ref 3.5–5.2)
Sodium: 140 mmol/L (ref 134–144)
Total Protein: 6.2 g/dL (ref 6.0–8.5)
eGFR: 73 mL/min/1.73 (ref >59)

## 2023-10-25 LAB — LIPID PANEL
Chol/HDL Ratio: 5.2 ratio — ABNORMAL HIGH (ref 0.0–5.0)
Cholesterol, Total: 202 mg/dL — ABNORMAL HIGH (ref 100–199)
HDL: 39 mg/dL — ABNORMAL LOW (ref >39)
LDL Chol Calc (NIH): 121 mg/dL — ABNORMAL HIGH (ref 0–99)
Triglycerides: 238 mg/dL — ABNORMAL HIGH (ref 0–149)
VLDL Cholesterol Cal: 42 mg/dL — ABNORMAL HIGH (ref 5–40)

## 2023-10-25 LAB — MICROALBUMIN / CREATININE URINE RATIO
Creatinine, Urine: 133.7 mg/dL
Microalb/Creat Ratio: 7 mg/g{creat} (ref 0–29)
Microalbumin, Urine: 9 ug/mL

## 2023-10-26 ENCOUNTER — Ambulatory Visit: Payer: Self-pay | Admitting: Family Medicine

## 2023-10-26 ENCOUNTER — Telehealth: Payer: Self-pay | Admitting: Family Medicine

## 2023-10-26 ENCOUNTER — Encounter: Payer: Self-pay | Admitting: Family Medicine

## 2023-10-26 ENCOUNTER — Other Ambulatory Visit: Payer: Self-pay | Admitting: Family Medicine

## 2023-10-26 NOTE — Telephone Encounter (Signed)
 Called to discuss lab results.  His cholesterol is high because he has Diabetes and needs LDL at 70 or less.  He is getting a cardiac CT scan and that may help him decide whether to take a statin.  No answer on his phone.

## 2023-11-06 ENCOUNTER — Other Ambulatory Visit: Payer: Self-pay | Admitting: Family Medicine

## 2023-11-06 DIAGNOSIS — J4550 Severe persistent asthma, uncomplicated: Secondary | ICD-10-CM

## 2023-11-07 ENCOUNTER — Telehealth: Payer: Self-pay | Admitting: Family Medicine

## 2023-11-07 ENCOUNTER — Ambulatory Visit

## 2023-11-07 NOTE — Telephone Encounter (Signed)
 10/1 233PM UNABLE TO REACH PT TO R/S WE DO NOT DO SAME DAY AWVS AND SENT ERROR CRM TO R/S TO [facility] ANNUAL WELLNESS SCHEDULE T OR THUR IN [place]

## 2023-11-17 ENCOUNTER — Ambulatory Visit (INDEPENDENT_AMBULATORY_CARE_PROVIDER_SITE_OTHER)

## 2023-11-17 ENCOUNTER — Ambulatory Visit
Admission: EM | Admit: 2023-11-17 | Discharge: 2023-11-17 | Disposition: A | Discharge status: Home / Self Care | Attending: Physician Assistant | Admitting: Physician Assistant

## 2023-11-17 ENCOUNTER — Encounter: Payer: Self-pay | Admitting: Emergency Medicine

## 2023-11-17 DIAGNOSIS — S91332A Puncture wound without foreign body, left foot, initial encounter: Secondary | ICD-10-CM | POA: Diagnosis not present

## 2023-11-17 DIAGNOSIS — Z8739 Personal history of other diseases of the musculoskeletal system and connective tissue: Secondary | ICD-10-CM

## 2023-11-17 DIAGNOSIS — R509 Fever, unspecified: Secondary | ICD-10-CM | POA: Diagnosis not present

## 2023-11-17 DIAGNOSIS — M19072 Primary osteoarthritis, left ankle and foot: Secondary | ICD-10-CM | POA: Diagnosis not present

## 2023-11-17 DIAGNOSIS — L03116 Cellulitis of left lower limb: Secondary | ICD-10-CM | POA: Diagnosis not present

## 2023-11-17 DIAGNOSIS — M79672 Pain in left foot: Secondary | ICD-10-CM

## 2023-11-17 DIAGNOSIS — M25572 Pain in left ankle and joints of left foot: Secondary | ICD-10-CM | POA: Diagnosis not present

## 2023-11-17 MED ORDER — CIPROFLOXACIN HCL 500 MG PO TABS: 500.0000 mg | ORAL_TABLET | Freq: Two times a day (BID) | ORAL | 0 refills | Status: AC

## 2023-11-17 MED ORDER — CEFTRIAXONE SODIUM 1 G IJ SOLR
1.0000 g | Freq: Once | INTRAMUSCULAR | Status: AC
  Administered 2023-11-17: 1 g via INTRAMUSCULAR

## 2023-11-17 NOTE — Discharge Instructions (Addendum)
-  Wet read x-ray was normal.  I did not note x-ray evidence of damage to the bone or bony infection. If the radiologist sees abnormality consistent with bone injury or infection I will call. - As we discussed, there are different bacteria that can infect a foot with a puncture wound. Cipro  is one of the best antibiotics for this type of injury but there is a chance it may not work and you will need different antibiotics -We did give you an injection of Rocephin  antibiotic in clinic today. Start the Cipro  today. - Continue to clean the wound with soap and water .  Avoid soaking it at this time. - You can have Tylenol  for pain relief if needed. Can also take Naproxen  if needed. - You should not have any fevers after the next couple of days. If you do you will need to go to the ER. If chills, vomiting, sweats, feeling weak, increased pain/swelling/redness or not improving in a couple of days go to ER. You developed osteomyelitis/bone infection a couple years ago and the same thing can happen again. - Please follow-up with your PCP or return here in 2 to 3 days for reexamination to make sure the infection is improving and resolving onset of worsening. - Again, if you have a return of the fever, worsening pain/redness or swelling you should go to the emergency department.

## 2023-11-17 NOTE — ED Triage Notes (Signed)
 Patient states that he stepped on a nail with his left foot 2 days ago and the nail went through his tennis shoe.  Patient states that he is having worse pain at the bottom of his left foot.  Patient reports pain when putting pressure on his left foot.

## 2023-11-17 NOTE — ED Provider Notes (Signed)
 [facility]    CSN: [id number] Arrival date & time: 11/17/23  1332      History   Chief Complaint Chief Complaint  Patient presents with   Puncture Wound    Left foot    HPI [name] is a 73 y.o. male presenting for left plantar foot pain.  Patient reports that he stepped on a [place] 2 [place].  He says he has had increased swelling and pain as well as a fever just below 101 degrees at night time. Patient reports that his tetanus was updated at his doctor's appointment a couple of years ago. He reports he has been soaking the foot and cleaning it well.  He reports that he has stepped on several nails throughout his life.  The last time he stepped on the nail was in 2023 and he developed osteomyelitis.   Patient has a history of autoimmune disease-palliative myalgia rheumatica. Additionally has history of asthma, COPD, arthritis, anxiety/depression and hyperlipidemia.  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   Hyperlipemia 11/23/2016   Hypertension associated with diabetes (HCC) 02/13/2023   OSA (obstructive sleep apnea) 06/13/2023    Patient Active Problem List   Diagnosis Date Noted   OSA (obstructive sleep apnea) 06/13/2023   Snores 05/07/2023   Localized, primary osteoarthritis of the ankle and foot, left 05/07/2023   Asthma in adult, moderate persistent, with acute exacerbation 03/13/2023   Anxiety 02/13/2023   Controlled diabetes mellitus type 2 with complications (HCC) 02/13/2023   Hypertension associated with diabetes (HCC) 02/13/2023   Long term current use of systemic steroids 07/11/2017   History of vitamin D  deficiency 04/04/2017   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   Hyperlipemia 11/23/2016   Encounter for screening  for coronary artery disease in patient with risk for coronary artery disease greater than 20% in next 10 years    Goiter, 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   History of gout 06/12/2016   Depression, 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 Authorizing Provider  ciprofloxacin  (CIPRO ) 500 MG tablet Take 1 tablet (500 mg total) by mouth every 12 (twelve) hours for 10 days. 11/17/23 11/27/23 Yes Arvis Jolan NOVAK, PA-C  ALPRAZolam  (XANAX ) 0.5 MG tablet Take 1 tablet (0.5 mg total) by mouth 2 (two) times daily. 02/13/23   Ziglar, Susan K, MD  amLODipine  (NORVASC ) 5 MG tablet Take 1 tablet (5 mg total) by mouth daily. 05/07/23   Ziglar, Susan K, MD  atorvastatin  (LIPITOR) 20 MG tablet Take 1 tablet (20 mg total) by mouth daily. 02/13/23   Ziglar, Susan K, MD  atorvastatin  (LIPITOR) 20 MG tablet Take 1 tablet (20  mg total) by mouth daily. 10/24/23   Ziglar, Susan K, MD  budesonide -formoterol  (SYMBICORT ) 160-4.5 MCG/ACT inhaler Inhale 2 puffs into the lungs 2 (two) times daily. 10/24/23   Ziglar, Susan K, MD  gabapentin  (NEURONTIN ) 100 MG capsule Take 1 capsule (100 mg total) by mouth 3 (three) times daily. 02/06/23   Ziglar, Susan K, MD  hydrOXYzine (ATARAX) 25 MG tablet Take 25 mg by mouth daily. Patient not taking: Reported on 06/13/2023 10/30/22   [provider]  ketoconazole 2%-triamcinolone  0.1% 1:2 cream mixture Apply topically daily. 06/15/20   Elnor Fairy HERO, NP   lisinopril  (ZESTRIL ) 5 MG tablet Take 1 tablet (5 mg total) by mouth daily. Patient not taking: Reported on 06/13/2023 02/13/23   Ziglar, Susan K, MD  lisinopril  (ZESTRIL ) 5 MG tablet Take 1 tablet (5 mg total) by mouth daily. 10/24/23   Ziglar, Susan K, MD  naproxen  (NAPROSYN ) 375 MG tablet Take 1 tablet (375 mg total) by mouth 2 (two) times daily with a meal. 05/07/23   Ziglar, Devere POUR, MD  PARoxetine  (PAXIL ) 20 MG tablet Take 1 tablet (20 mg total) by mouth daily. 05/07/23   Ziglar, Susan K, MD  PROAIR  HFA 108 (90 Base) MCG/ACT inhaler Inhale 2 puffs into the lungs every 4 (four) hours as needed for wheezing or shortness of breath. 10/24/23   Ziglar, Susan K, MD  sildenafil  (REVATIO ) 20 MG tablet Take 1 tablet (20 mg total) by mouth daily as needed (Erectile dysfunction). 03/06/23   Ziglar, Susan K, MD  triamcinolone  cream (KENALOG ) 0.1 % Apply 1 Application topically 2 (two) times daily. Patient not taking: Reported on 06/13/2023 10/30/22   [provider]  Vitamin D , Ergocalciferol , (DRISDOL) 1.25 MG (50000 UNIT) CAPS capsule Take 50,000 Units by mouth 3 (three) times a week. Patient not taking: Reported on 06/13/2023 10/30/22   [provider]    Family History Family History  Problem Relation Age of Onset   Diabetes Mother    Heart disease Mother    Heart disease Father        old age at 67   Heart disease Brother 41       cabg   Early death Son 41       MVA    Social History Social History   Tobacco Use   Smoking status: Former    Current packs/day: 0.00    Average packs/day: 1 pack/day for 10.0 years (10.0 ttl pk-yrs)    Types: Cigarettes    Start date: 02/05/1974    Quit date: 02/06/1984    Years since quitting: 39.8    Passive exposure: Never   Smokeless tobacco: Former  Building services engineer status: Never Used  Substance Use Topics   Alcohol use: Yes    Comment: occasionally   Drug use: No     Allergies   Patient has no known allergies.   Review of  Systems Review of Systems  Constitutional:  Positive for fever. Negative for fatigue.  Musculoskeletal:  Positive for arthralgias and joint swelling. Negative for gait problem.  Skin:  Positive for color change and wound.  Neurological:  Negative for weakness and numbness.     Physical Exam Triage Vital Signs ED Triage Vitals  Enc Vitals Group     BP      Pulse      Resp      Temp      Temp src      SpO2  Weight      Height      Head Circumference      Peak Flow      Pain Score      Pain Loc      Pain Edu?      Excl. in GC?    No data found.  Updated Vital Signs BP 139/81 (BP Location: Left Arm)   Pulse 65   Temp 98 F (36.7 C) (Oral)   Resp 15   Ht 6' 2 (1.88 m)   Wt 242 lb 15.2 oz (110.2 kg)   SpO2 97%   BMI 31.19 kg/m     Physical Exam Vitals and nursing note reviewed.  Constitutional:      General: He is not in acute distress.    Appearance: Normal appearance. He is well-developed. He is not ill-appearing.  HENT:     Head: Normocephalic and atraumatic.  Eyes:     General: No scleral icterus.    Conjunctiva/sclera: Conjunctivae normal.  Cardiovascular:     Rate and Rhythm: Normal rate.     Pulses: Normal pulses.  Pulmonary:     Effort: Pulmonary effort is normal. No respiratory distress.  Musculoskeletal:     Cervical back: Neck supple.  Skin:    General: Skin is warm and dry.     Capillary Refill: Capillary refill takes less than 2 seconds.     Comments: See image below.  There is a puncture of the lateral plantar foot.  Surrounding erythema and swelling. Area diffusely TTP.  No puncture of the dorsal foot.  There is no bleeding or drainage.  Neurological:     General: No focal deficit present.     Mental Status: He is alert. Mental status is at baseline.     Motor: No weakness.     Gait: Gait abnormal.  Psychiatric:        Mood and Affect: Mood normal.        Behavior: Behavior normal.         UC Treatments / Results   Labs (all labs ordered are listed, but only abnormal results are displayed) Labs Reviewed - No data to display  EKG   Radiology DG Foot Complete Left Result Date: 11/17/2023 CLINICAL DATA:  Plantar midfoot pain after stepping on nail EXAM: LEFT FOOT - COMPLETE 3 VIEW COMPARISON:  Left foot radiograph dated 01/10/2020 FINDINGS: There is no evidence of fracture or dislocation. Severe degenerative changes of the great toe metatarsal phalangeal joint and mild degenerative changes of the interphalangeal joint. Soft tissue haziness of the plantar midfoot. No radiopaque foreign body. IMPRESSION: 1. No acute fracture or dislocation. 2. Soft tissue haziness of the plantar midfoot, likely related to reported puncture wound. No radiopaque foreign body. Electronically Signed   By: Limin  Xu M.D.   On: 11/17/2023 14:20    Procedures Procedures (including critical care time)  Medications Ordered in UC Medications  cefTRIAXone  (ROCEPHIN ) injection 1 g (1 g Intramuscular Given 11/17/23 1423)    Initial Impression / Assessment and Plan / UC Course  I have reviewed the triage vital signs and the nursing notes.  Pertinent labs & imaging results that were available during my care of the patient were reviewed by me and considered in my medical decision making (see chart for details).  73 year old male presenting for left foot pain, swelling and redness.  Patient stepped on a nail 2 days ago.   Tetanus updated 2023. History of osteomyelitis of right foot due to  nail puncture 2 years ago.  BP normal.  He is afebrile.  Images taken of his foot.  He does have a puncture wound of the plantar foot and surrounding erythema and swelling as well as tenderness in this area.    X-ray of left foot obtained.  Assessing for potential foreign body and/or bony injury.  X-ray of foot reviewed by me.  X-ray shows no evidence of any abnormality.  Discussed this with patient. Over read negative.   Patient has obvious  cellulitis secondary to the puncture wound. It is concerning that he has had fevers at night time. Patient given a gram of Rocephin  in clinic today. Reviewed renal function labs last month--normal function.  Sent Cipro  x 10 days. Advised close monitoring and cleaning it well.  Advised avoiding soaking it.  Advised close monitoring and follow-up with PCP in the next couple of days or return to our clinic.  Reviewed ED precautions. Very high possibility of osteomyelitis infection developing again and I discussed that he needs to attend follow ups and monitor closely. Discussed precautions thoroughly.    Final Clinical Impressions(s) / UC Diagnoses   Final diagnoses:  Left foot pain  Puncture wound of left foot, initial encounter  Fever, unspecified  Cellulitis of left foot  History of osteomyelitis     Discharge Instructions          -Wet read x-ray was normal.  I did not note x-ray evidence of damage to the bone or bony infection. If the radiologist sees abnormality consistent with bone injury or infection I will call. - As we discussed, there are different bacteria that can infect a foot with a puncture wound. Cipro  is one of the best antibiotics for this type of injury but there is a chance it may not work and you will need different antibiotics -We did give you an injection of Rocephin  antibiotic in clinic today. Start the Cipro  today. - Continue to clean the wound with soap and water .  Avoid soaking it at this time. - You can have Tylenol  for pain relief if needed. Can also take Naproxen  if needed. - You should not have any fevers after the next couple of days. If you do you will need to go to the ER. If chills, vomiting, sweats, feeling weak, increased pain/swelling/redness or not improving in a couple of days go to ER. You developed osteomyelitis/bone infection a couple years ago and the same thing can happen again. - Please follow-up with your PCP or return here in 2 to 3 days for  reexamination to make sure the infection is improving and resolving onset of worsening. - Again, if you have a return of the fever, worsening pain/redness or swelling you should go to the emergency department.       ED Prescriptions     Medication Sig Dispense Auth. Provider   ciprofloxacin  (CIPRO ) 500 MG tablet Take 1 tablet (500 mg total) by mouth every 12 (twelve) hours for 10 days. 20 tablet Brianah Hopson B, PA-C      PDMP not reviewed this encounter.   Arvis Jolan NOVAK, PA-C 08/28/21 0932    Arvis Jolan NOVAK, PA-C 11/17/23 1431

## 2023-11-27 ENCOUNTER — Ambulatory Visit (INDEPENDENT_AMBULATORY_CARE_PROVIDER_SITE_OTHER): Admitting: Family Medicine

## 2023-11-27 ENCOUNTER — Encounter: Payer: Self-pay | Admitting: Family Medicine

## 2023-11-27 VITALS — BP 170/82 | HR 58 | Temp 97.9°F | Resp 18 | Ht 74.0 in | Wt 248.0 lb

## 2023-11-27 DIAGNOSIS — H1031 Unspecified acute conjunctivitis, right eye: Secondary | ICD-10-CM | POA: Diagnosis not present

## 2023-11-27 DIAGNOSIS — S91332S Puncture wound without foreign body, left foot, sequela: Secondary | ICD-10-CM | POA: Insufficient documentation

## 2023-11-27 DIAGNOSIS — S91331D Puncture wound without foreign body, right foot, subsequent encounter: Secondary | ICD-10-CM

## 2023-11-27 DIAGNOSIS — H109 Unspecified conjunctivitis: Secondary | ICD-10-CM | POA: Insufficient documentation

## 2023-11-27 DIAGNOSIS — S91339A Puncture wound without foreign body, unspecified foot, initial encounter: Secondary | ICD-10-CM | POA: Insufficient documentation

## 2023-11-27 DIAGNOSIS — J4541 Moderate persistent asthma with (acute) exacerbation: Secondary | ICD-10-CM

## 2023-11-27 MED ORDER — ALBUTEROL SULFATE HFA 108 (90 BASE) MCG/ACT IN AERS: 1.0000 | INHALATION_SPRAY | Freq: Four times a day (QID) | RESPIRATORY_TRACT | 3 refills | Status: AC | PRN

## 2023-11-27 MED ORDER — AMOXICILLIN-POT CLAVULANATE 875-125 MG PO TABS: 1.0000 | ORAL_TABLET | Freq: Two times a day (BID) | ORAL | 0 refills | Status: AC

## 2023-11-27 NOTE — Assessment & Plan Note (Signed)
 Stepped on a nail 11/17/2023 took ciprofloxacin .  Foot still hurts pretty badly and is swollen and feels warm.  Will treat with Augmentin  875 twice daily for 10 days.  If your foot is not getting any better or is getting worse we will need to do x-rays and possibly imaging to rule out osteomyelitis.

## 2023-11-27 NOTE — Progress Notes (Signed)
 Established Patient Office Visit  Subjective   Patient ID: [name], male    DOB: 24-Jun-1950  Age: 73 y.o. MRN: [id number]  Chief Complaint  Patient presents with   Eye Pain    Right     Eye Pain    HPI delightful 73 year old male with moderate persistent asthma, HTN, DMT2 (diet and exercise controlled), OSA, anxiety, mixed hyperlipidemia.  He stepped on a nail 11/17/2023.  Went to urgent care and got a shot of antibiotics and then was put on ciprofloxacin .  His foot is better but it still very swollen and tender.   He woke up and then injected right eye.  It has been watering heavily and it itches.  He has not been working around still recently and it is unlikely he has anything in his eye.  Reports it felt like his eye was cut and then it got injected.  He does not wear contact lenses.  His visual acuity is normal    Objective:     BP (!) 170/82 (BP Location: Left Arm, Patient Position: Sitting, Cuff Size: Normal)   Pulse (!) 58   Temp 97.9 F (36.6 C) (Oral)   Resp 18   Ht 6' 2 (1.88 m)   Wt 248 lb (112.5 kg)   SpO2 97%   BMI 31.84 kg/m    Physical Exam Vitals reviewed.  Constitutional:      Appearance: Normal appearance.  HENT:     Head: Normocephalic.  Eyes:     General: Vision grossly intact.        Right eye: Discharge present.        Left eye: No discharge.     Comments: Sclera injected inferiorly right eye.  Cardiovascular:     Rate and Rhythm: Normal rate.  Pulmonary:     Effort: Pulmonary effort is normal.  Neurological:     Mental Status: He is alert and oriented to person, place, and time.  Psychiatric:        Mood and Affect: Mood normal.        Behavior: Behavior normal.        Thought Content: Thought content normal.        Judgment: Judgment normal.          No results found for any visits on 11/27/23.    The 10-year ASCVD risk score (Arnett DK, et al., 2019) is: 62.1%    Assessment & Plan:  Asthma in adult, moderate  persistent, with acute exacerbation -     Albuterol  Sulfate HFA; Inhale 1-2 puffs into the lungs every 6 (six) hours as needed for wheezing or shortness of breath.  Dispense: 18 g; Refill: 3  Puncture wound of right foot, subsequent encounter Assessment & Plan: Stepped on a nail 11/17/2023 took ciprofloxacin .  Foot still hurts pretty badly and is swollen and feels warm.  Will treat with Augmentin  875 twice daily for 10 days.  If your foot is not getting any better or is getting worse we will need to do x-rays and possibly imaging to rule out osteomyelitis.  Orders: -     Amoxicillin -Pot Clavulanate; Take 1 tablet by mouth 2 (two) times daily.  Dispense: 20 tablet; Refill: 0  Acute conjunctivitis of right eye, unspecified acute conjunctivitis type Assessment & Plan: Will cover the puncture wound on his foot with Augmentin .  This should cover his eye as well.  If he is not doing well please let me know  Return if symptoms worsen or fail to improve.    Santiel Topper K Raevyn Sokol, MD

## 2023-11-27 NOTE — Assessment & Plan Note (Signed)
 Will cover the puncture wound on his foot with Augmentin .  This should cover his eye as well.  If he is not doing well please let me know

## 2023-11-29 DIAGNOSIS — G4733 Obstructive sleep apnea (adult) (pediatric): Secondary | ICD-10-CM | POA: Diagnosis not present

## 2023-12-04 ENCOUNTER — Other Ambulatory Visit: Payer: Self-pay | Admitting: Family Medicine

## 2023-12-04 DIAGNOSIS — F419 Anxiety disorder, unspecified: Secondary | ICD-10-CM

## 2023-12-18 ENCOUNTER — Ambulatory Visit

## 2024-01-07 ENCOUNTER — Encounter: Payer: Self-pay | Admitting: Family Medicine

## 2024-01-07 ENCOUNTER — Other Ambulatory Visit: Payer: Self-pay | Admitting: Family Medicine

## 2024-01-07 DIAGNOSIS — N529 Male erectile dysfunction, unspecified: Secondary | ICD-10-CM

## 2024-01-07 DIAGNOSIS — F419 Anxiety disorder, unspecified: Secondary | ICD-10-CM

## 2024-01-07 MED ORDER — SILDENAFIL CITRATE 20 MG PO TABS: 20.0000 mg | ORAL_TABLET | Freq: Every day | ORAL | 5 refills | Status: AC | PRN

## 2024-01-07 MED ORDER — PAROXETINE HCL 20 MG PO TABS: 20.0000 mg | ORAL_TABLET | Freq: Every day | ORAL | 1 refills | Status: AC

## 2024-01-10 ENCOUNTER — Ambulatory Visit: Admitting: Family Medicine

## 2024-01-10 ENCOUNTER — Encounter: Payer: Self-pay | Admitting: Family Medicine

## 2024-01-10 VITALS — BP 134/80 | HR 61 | Ht 74.0 in | Wt 242.8 lb

## 2024-01-10 DIAGNOSIS — S91332S Puncture wound without foreign body, left foot, sequela: Secondary | ICD-10-CM | POA: Diagnosis not present

## 2024-01-10 DIAGNOSIS — S91331D Puncture wound without foreign body, right foot, subsequent encounter: Secondary | ICD-10-CM

## 2024-01-10 NOTE — Progress Notes (Signed)
   Established Patient Office Visit  Subjective   Patient ID: [name], male    DOB: 10-Aug-1950  Age: 73 y.o. MRN: [id number]  Chief Complaint  Patient presents with   Follow Up Foot Wound    HPI Delightful 73 year old gentleman with moderate persistent asthma, HTN, DMT2 (diet and exercise controlled) OSA, anxiety, mixed hyperlipidemia. He stepped on a nail 11/17/2023 and he still having pain.  He did get a tetanus shot and was treated with antibiotics at the time of the puncture wound.  He mostly has pain when walking and little pain at rest.  There has been no drainage from the wound.  His foot is not swollen nor erythematous but it is tender to palpation. Denies having problems with asthma.  Usually later in winter when he has problems with asthma.      Objective:     BP 134/80   Pulse 61   Ht 6' 2 (1.88 m)   Wt 242 lb 12.8 oz (110.1 kg)   SpO2 92%   BMI 31.17 kg/m    Physical Exam Feet:     Comments: Tenderness from the lateral fifth metatarsal head to his toes.  No erythema or edema.  No drainage from the wound.         No results found for any visits on 01/10/24.    The 10-year ASCVD risk score (Arnett DK, et al., 2019) is: 46.9%    Assessment & Plan:  Puncture wound of right foot, subsequent encounter Assessment & Plan: Stat x-rays today looking for osteomyelitis.  Referral to podiatry Dr. Norleen Armor in [place], [place] [phone],  Thanks  Orders: -     DG Foot Complete Left; Future  Puncture wound to foot, left, sequela Assessment & Plan: Stat x-rays today looking for osteomyelitis.  Referral to podiatry Dr. Norleen Armor in [place], [place] [phone],  Thanks  Orders: -     Ambulatory referral to Podiatry     Return in about 3 months (around 04/09/2024).    Jovi Alvizo K Dejana Pugsley, MD

## 2024-01-10 NOTE — Assessment & Plan Note (Addendum)
 Stat x-rays today looking for osteomyelitis.  Referral to podiatry Dr. Norleen Armor in [place], [place] [phone],  Thanks

## 2024-01-17 ENCOUNTER — Ambulatory Visit: Admitting: Podiatry

## 2024-01-17 DIAGNOSIS — M7672 Peroneal tendinitis, left leg: Secondary | ICD-10-CM

## 2024-01-17 NOTE — Progress Notes (Signed)
 Subjective:  Patient ID: [name], male    DOB: 1950/06/07,  MRN: [id number]  Chief Complaint  Patient presents with   Foot Pain    Pt stated that he stepped on a nail he went to the hospital had xrays on 11/17/23 he stated that his foot is still swollen and he is unable to put pressure on his foot     73 y.o. male presents with the above complaint.  Patient presents with complaint of left peroneal tendinitis with pain at the insertion.  Patient does not have any open wounds or entry point.  No signs of foreign body or puncture wound.  He states causing him a lot of discomfort is causing him to be painful and swollen.  He had x-rays done in the hospital which did not show any foreign body.  He would like to get it evaluated denies seeing anyone as prior to seeing me denies any other acute complaints.   Review of Systems: Negative except as noted in the HPI. Denies N/V/F/Ch.  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 There is no height or weight on file to calculate BMI. Constitutional Well developed. Well nourished.  Vascular Dorsalis pedis pulses palpable bilaterally. Posterior tibial pulses palpable bilaterally. Capillary refill normal to all digits.  No cyanosis or clubbing noted. Pedal hair growth normal.  Neurologic Normal speech. Oriented to person, place, and time. Epicritic sensation to light touch grossly present bilaterally.  Dermatologic Nails well groomed and normal in appearance. No open wounds. No skin lesions.   Orthopedic: Pain on palpation at the insertion of the peroneal tendon pain with dorsiflexion eversion of the foot resisted no pain with plantarflexion inversion of the foot.  No pain along the course of the peroneal tendon   Radiographs: None Assessment:   1. Peroneal tendinitis, left    Plan:  Patient was evaluated and treated and all questions answered.  Left peroneal tendinitis - All questions and concerns were discussed with the patient excessive due to given the amount of pain that he is having a benefit from a steroid injection I discussed the risk of rupture of surgery and he states understanding like to proceed despite the risks. -A steroid injection was performed at left lateral foot at point of maximal tenderness using 1% plain Lidocaine  and 10 mg of Kenalog . This was well tolerated. - Clinically no signs of infection noted. - Shoe gear modification discussed    No follow-ups on file.      [1]  Current Outpatient Medications:    albuterol  (VENTOLIN  HFA) 108 (90 Base) MCG/ACT inhaler, Inhale 1-2 puffs into the lungs every 6 (six) hours as needed for wheezing or shortness of breath., Disp: 18 g, Rfl: 3   ALPRAZolam  (XANAX ) 0.5 MG tablet, Take 1 tablet (0.5 mg total) by mouth 2 (two) times daily. (Patient not taking: Reported on 01/10/2024), Disp: 60 tablet, Rfl: 2   amLODipine  (NORVASC ) 5 MG tablet, Take 1 tablet (5 mg total) by mouth daily. (Patient not taking: Reported on 01/10/2024), Disp:  90 tablet, Rfl: 1   amoxicillin -clavulanate (AUGMENTIN ) 875-125 MG tablet, Take 1 tablet by mouth 2 (two) times daily., Disp: 20 tablet, Rfl: 0   atorvastatin  (LIPITOR) 20 MG tablet, Take 1 tablet (20 mg total) by mouth daily. (Patient not taking: Reported on 01/10/2024), Disp: 90 tablet, Rfl: 3   atorvastatin  (LIPITOR) 20 MG tablet, Take 1 tablet (20 mg total) by mouth daily. (Patient not taking: Reported on 01/10/2024), Disp: 90 tablet, Rfl: 3   budesonide -formoterol  (SYMBICORT ) 160-4.5  MCG/ACT inhaler, Inhale 2 puffs into the lungs 2 (two) times daily., Disp: 3 each, Rfl: 3   gabapentin  (NEURONTIN ) 100 MG capsule, Take 1 capsule (100 mg total) by mouth 3 (three) times daily. (Patient not taking: Reported on 01/10/2024), Disp: 90 capsule, Rfl: 3   hydrOXYzine (ATARAX) 25 MG tablet, Take 25 mg by mouth daily. (Patient not taking: Reported on 06/13/2023), Disp: , Rfl:    ketoconazole 2%-triamcinolone  0.1% 1:2 cream mixture, Apply topically daily. (Patient not taking: Reported on 01/10/2024), Disp: 45 g, Rfl: 1   lisinopril  (ZESTRIL ) 5 MG tablet, Take 1 tablet (5 mg total) by mouth daily. (Patient not taking: Reported on 06/13/2023), Disp: 90 tablet, Rfl: 3   lisinopril  (ZESTRIL ) 5 MG tablet, Take 1 tablet (5 mg total) by mouth daily., Disp: 90 tablet, Rfl: 3   meloxicam  (MOBIC ) 15 MG tablet, Take 1 tablet (15 mg total) by mouth daily., Disp: 30 tablet, Rfl: 0   methylPREDNISolone  (MEDROL  DOSEPAK) 4 MG TBPK tablet, Take as directed, Disp: 21 each, Rfl: 0   naproxen  (NAPROSYN ) 375 MG tablet, Take 1 tablet (375 mg total) by mouth 2 (two) times daily with a meal., Disp: 60 tablet, Rfl: 2   PARoxetine  (PAXIL ) 20 MG tablet, Take 1 tablet (20 mg total) by mouth daily., Disp: 90 tablet, Rfl: 1   PROAIR  HFA 108 (90 Base) MCG/ACT inhaler, INHALE 2 PUFFS INTO THE LUNGS EVERY 4 HOURS AS NEEDED FOR WHEEZING OR SHORTNESS OF BREATH, Disp: 8.5 g, Rfl: 3   sildenafil  (REVATIO ) 20 MG tablet, Take 1 tablet (20 mg total) by mouth daily as needed (Erectile dysfunction)., Disp: 30 tablet, Rfl: 5   triamcinolone  cream (KENALOG ) 0.1 %, Apply 1 Application topically 2 (two) times daily. (Patient not taking: Reported on 06/13/2023), Disp: , Rfl:    Vitamin D , Ergocalciferol , (DRISDOL) 1.25 MG (50000 UNIT) CAPS capsule, Take 50,000 Units by mouth 3 (three) times a week. (Patient not taking: Reported on 06/13/2023), Disp: , Rfl:  [2]  Social History Tobacco Use  Smoking Status Former   Current packs/day: 0.00   Average  packs/day: 1 pack/day for 10.0 years (10.0 ttl pk-yrs)   Types: Cigarettes   Start date: 02/05/1974   Quit date: 02/06/1984   Years since quitting: 39.9   Passive exposure: Never  Smokeless Tobacco Former  [3] No Known Allergies

## 2024-01-18 ENCOUNTER — Other Ambulatory Visit: Payer: Self-pay | Admitting: Podiatry

## 2024-01-18 MED ORDER — MELOXICAM 15 MG PO TABS: 15.0000 mg | ORAL_TABLET | Freq: Every day | ORAL | 0 refills | Status: AC

## 2024-01-18 MED ORDER — METHYLPREDNISOLONE 4 MG PO TBPK: ORAL_TABLET | ORAL | 0 refills | Status: AC

## 2024-02-14 ENCOUNTER — Ambulatory Visit: Admitting: Podiatry

## 2024-04-09 ENCOUNTER — Ambulatory Visit: Admitting: Family Medicine
